# Patient Record
Sex: Male | Born: 1985 | Race: White | Hispanic: No | Marital: Single | Smoking: Former smoker
Health system: Southern US, Community
[De-identification: ages and names within clinical notes are randomized; demographics above are authoritative.]

## PROBLEM LIST (undated history)

## (undated) DIAGNOSIS — G589 Mononeuropathy, unspecified: Secondary | ICD-10-CM

## (undated) DIAGNOSIS — K5790 Diverticulosis of intestine, part unspecified, without perforation or abscess without bleeding: Secondary | ICD-10-CM

## (undated) HISTORY — DX: Diverticulosis of intestine, part unspecified, without perforation or abscess without bleeding: K57.90

---
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
## 2005-12-16 ENCOUNTER — Emergency Department (HOSPITAL_COMMUNITY): Admission: EM | Admit: 2005-12-16 | Discharge: 2005-12-16 | Payer: Self-pay | Admitting: Emergency Medicine

## 2006-07-10 ENCOUNTER — Emergency Department (HOSPITAL_COMMUNITY): Admission: EM | Admit: 2006-07-10 | Discharge: 2006-07-10 | Payer: Self-pay | Admitting: Emergency Medicine

## 2006-07-27 ENCOUNTER — Emergency Department (HOSPITAL_COMMUNITY): Admission: EM | Admit: 2006-07-27 | Discharge: 2006-07-27 | Payer: Self-pay | Admitting: Family Medicine

## 2007-01-11 ENCOUNTER — Emergency Department (HOSPITAL_COMMUNITY): Admission: EM | Admit: 2007-01-11 | Discharge: 2007-01-11 | Payer: Self-pay | Admitting: Family Medicine

## 2007-02-15 ENCOUNTER — Emergency Department (HOSPITAL_COMMUNITY): Admission: EM | Admit: 2007-02-15 | Discharge: 2007-02-15 | Payer: Self-pay | Admitting: Emergency Medicine

## 2007-03-24 ENCOUNTER — Emergency Department (HOSPITAL_COMMUNITY): Admission: EM | Admit: 2007-03-24 | Discharge: 2007-03-24 | Payer: Self-pay | Admitting: Family Medicine

## 2008-01-27 ENCOUNTER — Emergency Department (HOSPITAL_COMMUNITY): Admission: EM | Admit: 2008-01-27 | Discharge: 2008-01-27 | Payer: Self-pay | Admitting: Emergency Medicine

## 2009-11-28 ENCOUNTER — Emergency Department (HOSPITAL_COMMUNITY): Admission: EM | Admit: 2009-11-28 | Discharge: 2009-11-28 | Payer: Self-pay | Admitting: Family Medicine

## 2010-11-07 ENCOUNTER — Emergency Department (HOSPITAL_COMMUNITY)
Admission: EM | Admit: 2010-11-07 | Discharge: 2010-11-08 | Disposition: A | Discharge status: Home / Self Care | Payer: Self-pay | Attending: Emergency Medicine | Admitting: Emergency Medicine

## 2010-11-07 DIAGNOSIS — R1110 Vomiting, unspecified: Secondary | ICD-10-CM | POA: Insufficient documentation

## 2010-11-07 DIAGNOSIS — R109 Unspecified abdominal pain: Secondary | ICD-10-CM | POA: Insufficient documentation

## 2010-11-07 LAB — COMPREHENSIVE METABOLIC PANEL
ALT: 15 U/L (ref 0–53)
AST: 18 U/L (ref 0–37)
Albumin: 4.4 g/dL (ref 3.5–5.2)
Alkaline Phosphatase: 53 U/L (ref 39–117)
BUN: 17 mg/dL (ref 6–23)
CO2: 28 mEq/L (ref 19–32)
Calcium: 9.3 mg/dL (ref 8.4–10.5)
Chloride: 109 mEq/L (ref 96–112)
Creatinine, Ser: 0.9 mg/dL (ref 0.4–1.5)
GFR calc Af Amer: >60 mL/min (ref >60)
GFR calc non Af Amer: >60 mL/min (ref >60)
Glucose, Bld: 108 mg/dL — ABNORMAL HIGH (ref 70–99)
Potassium: 3.7 mEq/L (ref 3.5–5.1)
Sodium: 141 mEq/L (ref 135–145)
Total Bilirubin: 0.2 mg/dL — ABNORMAL LOW (ref 0.3–1.2)
Total Protein: 6.5 g/dL (ref 6.0–8.3)

## 2010-11-07 LAB — CBC
HCT: 38.2 % — ABNORMAL LOW (ref 39.0–52.0)
Hemoglobin: 13.2 g/dL (ref 13.0–17.0)
MCH: 30.1 pg (ref 26.0–34.0)
MCHC: 34.6 g/dL (ref 30.0–36.0)
MCV: 87.2 fL (ref 78.0–100.0)
Platelets: 179 10*3/uL (ref 150–400)
RBC: 4.38 MIL/uL (ref 4.22–5.81)
RDW: 12.5 % (ref 11.5–15.5)
WBC: 7.9 10*3/uL (ref 4.0–10.5)

## 2010-11-07 LAB — URINALYSIS, ROUTINE W REFLEX MICROSCOPIC
Glucose, UA: NEGATIVE mg/dL
Hgb urine dipstick: NEGATIVE
Nitrite: NEGATIVE
Protein, ur: NEGATIVE mg/dL
Specific Gravity, Urine: 1.025 (ref 1.005–1.030)
Urobilinogen, UA: 1 mg/dL (ref 0.0–1.0)
pH: 8 (ref 5.0–8.0)

## 2010-11-07 LAB — DIFFERENTIAL
Basophils Absolute: 0 10*3/uL (ref 0.0–0.1)
Basophils Relative: 0 % (ref 0–1)
Eosinophils Absolute: 0.1 10*3/uL (ref 0.0–0.7)
Eosinophils Relative: 1 % (ref 0–5)
Lymphocytes Relative: 22 % (ref 12–46)
Lymphs Abs: 1.7 10*3/uL (ref 0.7–4.0)
Monocytes Absolute: 0.7 10*3/uL (ref 0.1–1.0)
Monocytes Relative: 9 % (ref 3–12)
Neutro Abs: 5.3 10*3/uL (ref 1.7–7.7)
Neutrophils Relative %: 68 % (ref 43–77)

## 2010-11-07 LAB — OCCULT BLOOD, POC DEVICE: Fecal Occult Bld: NEGATIVE

## 2010-11-18 ENCOUNTER — Emergency Department (HOSPITAL_COMMUNITY)
Admission: EM | Admit: 2010-11-18 | Discharge: 2010-11-18 | Disposition: A | Discharge status: Home / Self Care | Payer: Self-pay | Attending: Emergency Medicine | Admitting: Emergency Medicine

## 2010-11-18 DIAGNOSIS — R109 Unspecified abdominal pain: Secondary | ICD-10-CM | POA: Insufficient documentation

## 2010-11-18 LAB — URINALYSIS, ROUTINE W REFLEX MICROSCOPIC
Glucose, UA: NEGATIVE mg/dL
Ketones, ur: NEGATIVE mg/dL
Protein, ur: NEGATIVE mg/dL
Urobilinogen, UA: 0.2 mg/dL (ref 0.0–1.0)

## 2010-11-18 LAB — COMPREHENSIVE METABOLIC PANEL
Albumin: 4.7 g/dL (ref 3.5–5.2)
BUN: 13 mg/dL (ref 6–23)
Calcium: 9.3 mg/dL (ref 8.4–10.5)
Chloride: 106 mEq/L (ref 96–112)
Creatinine, Ser: 0.8 mg/dL (ref 0.4–1.5)
GFR calc non Af Amer: >60 mL/min (ref >60)
Total Bilirubin: 0.4 mg/dL (ref 0.3–1.2)

## 2010-11-18 LAB — DIFFERENTIAL
Basophils Absolute: 0.1 10*3/uL (ref 0.0–0.1)
Basophils Relative: 1 % (ref 0–1)
Eosinophils Absolute: 0.1 10*3/uL (ref 0.0–0.7)
Eosinophils Relative: 1 % (ref 0–5)
Monocytes Absolute: 1.4 10*3/uL — ABNORMAL HIGH (ref 0.1–1.0)
Monocytes Relative: 11 % (ref 3–12)

## 2010-11-18 LAB — CBC
MCH: 30 pg (ref 26.0–34.0)
MCHC: 34.5 g/dL (ref 30.0–36.0)
Platelets: 198 10*3/uL (ref 150–400)
RDW: 12.4 % (ref 11.5–15.5)

## 2011-05-14 LAB — DIFFERENTIAL
Basophils Absolute: 0
Lymphocytes Relative: 23
Neutro Abs: 5.4

## 2011-05-14 LAB — CBC
Hemoglobin: 14.9
Platelets: 181
RDW: 13
WBC: 8.4

## 2011-05-14 LAB — POCT INFECTIOUS MONO SCREEN: Mono Screen: NEGATIVE

## 2011-05-14 LAB — POCT RAPID STREP A: Streptococcus, Group A Screen (Direct): NEGATIVE

## 2012-09-28 ENCOUNTER — Encounter (HOSPITAL_COMMUNITY): Payer: Self-pay | Admitting: Emergency Medicine

## 2012-09-28 ENCOUNTER — Emergency Department (HOSPITAL_COMMUNITY)
Admission: EM | Admit: 2012-09-28 | Discharge: 2012-09-28 | Disposition: A | Discharge status: Home / Self Care | Payer: Self-pay | Attending: Emergency Medicine | Admitting: Emergency Medicine

## 2012-09-28 DIAGNOSIS — F17200 Nicotine dependence, unspecified, uncomplicated: Secondary | ICD-10-CM | POA: Insufficient documentation

## 2012-09-28 DIAGNOSIS — H5710 Ocular pain, unspecified eye: Secondary | ICD-10-CM | POA: Insufficient documentation

## 2012-09-28 DIAGNOSIS — H5712 Ocular pain, left eye: Secondary | ICD-10-CM

## 2012-09-28 DIAGNOSIS — Z79899 Other long term (current) drug therapy: Secondary | ICD-10-CM | POA: Insufficient documentation

## 2012-09-28 MED ORDER — HYDROCODONE-ACETAMINOPHEN 5-325 MG PO TABS: 2.0000 | ORAL_TABLET | ORAL | Status: DC | PRN

## 2012-09-28 MED ORDER — FLUORESCEIN SODIUM 1 MG OP STRP
ORAL_STRIP | OPHTHALMIC | Status: AC
  Administered 2012-09-28: 2
  Filled 2012-09-28: qty 2

## 2012-09-28 MED ORDER — ERYTHROMYCIN 5 MG/GM OP OINT: TOPICAL_OINTMENT | OPHTHALMIC | Status: DC

## 2012-09-28 MED ORDER — TETRACAINE HCL 0.5 % OP SOLN
1.0000 [drp] | Freq: Once | OPHTHALMIC | Status: AC
  Administered 2012-09-28: 1 [drp] via OPHTHALMIC
  Filled 2012-09-28: qty 2

## 2012-09-28 NOTE — ED Provider Notes (Signed)
History     CSN: [id number]  Arrival date & time 09/28/12  1240   First MD Initiated Contact with Patient 09/28/12 1249      Chief Complaint  Patient presents with  . Eye Pain    (Consider location/radiation/quality/duration/timing/severity/associated sxs/prior treatment) HPI Comments: This is a 27 year old male, no past medical history, who presents emergency department with chief complaint of left eye pain and irritation. Patient states that he was walking around outside, when he thought something was blown into his eye. He states that he has irrigated his eye copiously, but still feels irritated. He states that the irritation is moderate in severity. It is associated with tearing, and reluctance to open the eye. He's not tried taking any medicine for his symptoms.he denies blurred vision, or double vision.  The history is provided by the patient. No language interpreter was used.    History reviewed. No pertinent past medical history.  History reviewed. No pertinent past surgical history.  History reviewed. No pertinent family history.  History  Substance Use Topics  . Smoking status: Current Every Day Smoker  . Smokeless tobacco: Not on file  . Alcohol Use: No      Review of Systems  All other systems reviewed and are negative.    Allergies  Review of patient's allergies indicates no known allergies.  Home Medications   Current Outpatient Rx  Name  Route  Sig  Dispense  Refill  . ranitidine (ZANTAC) 75 MG tablet   Oral   Take 75 mg by mouth 2 (two) times daily.         Marland Kitchen erythromycin ophthalmic ointment      Place a 1/2 inch ribbon of ointment into the lower eyelid.   1 g   0   . HYDROcodone-acetaminophen (NORCO/VICODIN) 5-325 MG per tablet   Oral   Take 2 tablets by mouth every 4 (four) hours as needed for pain.   6 tablet   0     BP 150/93  Pulse 88  Temp(Src) 97.7 F (36.5 C) (Oral)  Resp 20  SpO2 99%  Physical Exam  Nursing note and  vitals reviewed. Constitutional: He is oriented to person, place, and time. He appears well-developed and well-nourished.  HENT:  Head: Normocephalic and atraumatic.  Eyes: Conjunctivae and EOM are normal. Pupils are equal, round, and reactive to light. Right eye exhibits no discharge. Left eye exhibits no discharge. No scleral icterus.  No foreign bodies seen, conjunctiva is red, eyelid was everted, also with no evidence of foreign body. Cornea and conjunctiva were inspected with the [facility] lamp, which revealed no corneal abrasions, eye was normal pressure  Neck: Normal range of motion.  Cardiovascular: Normal rate.   Pulmonary/Chest: Effort normal.  Abdominal: He exhibits no distension.  Musculoskeletal: Normal range of motion.  Neurological: He is alert and oriented to person, place, and time.  Skin: Skin is dry.  Psychiatric: He has a normal mood and affect. His behavior is normal. Judgment and thought content normal.    ED Course  Procedures (including critical care time)  Labs Reviewed - No data to display No results found.   1. Eye pain, left       MDM  27 year old male with eye irritation. No foreign bodies were visualized. Irrigated the patient's eye with a Morgan lens, after which she reports feeling much better. I will discharge the patient with Romycin, and a few pain pills. Patient is encouraged to followup with ophthalmology tomorrow if his symptoms  do not improve. Patient understands and agrees with the plan. He is stable and ready for discharge.        Roxy Horseman, PA-C 09/28/12 [phone]

## 2012-09-28 NOTE — ED Notes (Signed)
Pt sts left eye pain and irritation after getting something in left eye; redness and watering noticed

## 2012-09-29 NOTE — ED Provider Notes (Signed)
Medical screening examination/treatment/procedure(s) were performed by non-physician practitioner and as supervising physician I was immediately available for consultation/collaboration.    Aubrea Meixner D Shashana Fullington, MD 09/29/12 0856 

## 2013-02-18 ENCOUNTER — Emergency Department (HOSPITAL_COMMUNITY)
Admission: EM | Admit: 2013-02-18 | Discharge: 2013-02-18 | Disposition: A | Discharge status: Home / Self Care | Payer: Self-pay | Attending: Emergency Medicine | Admitting: Emergency Medicine

## 2013-02-18 ENCOUNTER — Encounter (HOSPITAL_COMMUNITY): Payer: Self-pay | Admitting: *Deleted

## 2013-02-18 DIAGNOSIS — Z79899 Other long term (current) drug therapy: Secondary | ICD-10-CM | POA: Insufficient documentation

## 2013-02-18 DIAGNOSIS — F17200 Nicotine dependence, unspecified, uncomplicated: Secondary | ICD-10-CM | POA: Insufficient documentation

## 2013-02-18 DIAGNOSIS — L255 Unspecified contact dermatitis due to plants, except food: Secondary | ICD-10-CM | POA: Insufficient documentation

## 2013-02-18 MED ORDER — CLOBETASOL PROPIONATE 0.05 % EX CREA: TOPICAL_CREAM | CUTANEOUS | Status: DC

## 2013-02-18 NOTE — ED Notes (Signed)
Pt reports progressively worsening rash to bilateral arms, left side of neck, and left of face around eye. Pt states that he was clearing a sign on Friday that may of had poison ivy. States yesterday noticed rash and this am states rash had started to spread and become very uncomfortable. Airway is intact. Pt is cholamine lotion to arms. Pt took benadryl around 9pm 7/19.

## 2013-02-18 NOTE — ED Provider Notes (Signed)
   History    CSN: [id number] Arrival date & time 02/18/13  4098  First MD Initiated Contact with Patient 02/18/13 [phone]     Chief Complaint  Patient presents with  . Rash   (Consider location/radiation/quality/duration/timing/severity/associated sxs/prior Treatment) HPI This is a 27 year old male who was exposed topically to some shrubbery as he was cleaning around a stop sign. It apparently contained poison ivy as he developed a vesicular rash on his arms yesterday which worsened this morning. It is very pruritic. He has been treating it with Benadryl and topical calamine without adequate relief. He also has a small amount adjacent to the left eye. He denies throat swelling, shortness of breath, nausea, vomiting or diarrhea.  No past medical history on file. No past surgical history on file. No family history on file. History  Substance Use Topics  . Smoking status: Current Every Day Smoker  . Smokeless tobacco: Not on file  . Alcohol Use: No    Review of Systems  All other systems reviewed and are negative.     Allergies  Review of patient's allergies indicates no known allergies.  Home Medications   Current Outpatient Rx  Name  Route  Sig  Dispense  Refill  . erythromycin ophthalmic ointment      Place a 1/2 inch ribbon of ointment into the lower eyelid.   1 g   0   . HYDROcodone-acetaminophen (NORCO/VICODIN) 5-325 MG per tablet   Oral   Take 2 tablets by mouth every 4 (four) hours as needed for pain.   6 tablet   0   . ranitidine (ZANTAC) 75 MG tablet   Oral   Take 75 mg by mouth 2 (two) times daily.          BP 144/89  Pulse 84  Temp(Src) 97 F (36.1 C) (Oral)  Resp 20  SpO2 100%  Physical Exam General: Well-developed, well-nourished male in no acute distress; appearance consistent with age of record HENT: normocephalic, atraumatic Eyes: pupils equal round and reactive to light; extraocular muscles intact Neck: supple Heart: regular rate and  rhythm Lungs: clear to auscultation bilaterally Abdomen: soft; nondistended Extremities: No deformity; full range of motion Neurologic: Awake, alert and oriented; motor function intact in all extremities and symmetric; no facial droop Skin: Warm and dry; sparse vesicular rash of the upper extremities consistent with poison oak or ivy; small area of similar rash adjacent to medial aspect of left eye Psychiatric: Normal mood and affect    ED Course  Procedures (including critical care time)   MDM  We'll treat with topical clobetasol and invited him to continue Benadryl for itching.  Hanley Seamen, MD 02/18/13 [phone]

## 2013-02-19 ENCOUNTER — Encounter (HOSPITAL_COMMUNITY): Payer: Self-pay | Admitting: *Deleted

## 2013-02-19 ENCOUNTER — Emergency Department (HOSPITAL_COMMUNITY)
Admission: EM | Admit: 2013-02-19 | Discharge: 2013-02-19 | Disposition: A | Discharge status: Home / Self Care | Payer: Self-pay | Attending: Emergency Medicine | Admitting: Emergency Medicine

## 2013-02-19 DIAGNOSIS — L237 Allergic contact dermatitis due to plants, except food: Secondary | ICD-10-CM

## 2013-02-19 DIAGNOSIS — Z79899 Other long term (current) drug therapy: Secondary | ICD-10-CM | POA: Insufficient documentation

## 2013-02-19 DIAGNOSIS — F17200 Nicotine dependence, unspecified, uncomplicated: Secondary | ICD-10-CM | POA: Insufficient documentation

## 2013-02-19 DIAGNOSIS — L255 Unspecified contact dermatitis due to plants, except food: Secondary | ICD-10-CM | POA: Insufficient documentation

## 2013-02-19 MED ORDER — PREDNISONE 20 MG PO TABS: 60.0000 mg | ORAL_TABLET | Freq: Every day | ORAL | Status: DC

## 2013-02-19 MED ORDER — PREDNISONE 20 MG PO TABS
60.0000 mg | ORAL_TABLET | Freq: Once | ORAL | Status: AC
  Administered 2013-02-19: 60 mg via ORAL
  Filled 2013-02-19: qty 3

## 2013-02-19 NOTE — ED Provider Notes (Signed)
History    This chart was scribed for non-physician practitioner Arnoldo Hooker PA-C working with Carleene Cooper III, MD by Donne Anon, ED Scribe. This patient was seen in room TR05C/TR05C and the patient's care was started at 1602.  CSN: [id number] Arrival date & time 02/19/13  1528  First MD Initiated Contact with Patient 02/19/13 [phone]     Chief Complaint  Patient presents with  . Poison Ivy     The history is provided by the patient. No language interpreter was used.   HPI Comments: Terrence Warner is a 27 y.o. male who presents to the Emergency Department complaining of 2 days of a gradual onset, gradually worsening rash that began on his face and has since spread to his arms, neck and groin. He was seen in the ED yesterday and given a topical cream, but states the rash has worsened, prompting him to return. He was exposed to poison ivy 2 days ago while he was working outside. He states that Benadryl has helped with the itching. He denies visual disturbance or any other pain.   History reviewed. No pertinent past medical history. History reviewed. No pertinent past surgical history. No family history on file. History  Substance Use Topics  . Smoking status: Current Every Day Smoker  . Smokeless tobacco: Not on file  . Alcohol Use: No    Review of Systems  Eyes: Negative for visual disturbance.  Skin: Positive for rash.  All other systems reviewed and are negative.    Allergies  Review of patient's allergies indicates no known allergies.  Home Medications   Current Outpatient Rx  Name  Route  Sig  Dispense  Refill  . calamine lotion   Topical   Apply 1 application topically as needed (for skin irritation).         . clobetasol cream (TEMOVATE) 0.05 %      Apply a thin layer to the affected areas twice daily.   30 g   0   . diphenhydrAMINE (BENADRYL) 25 MG tablet   Oral   Take 25 mg by mouth every 8 (eight) hours as needed for itching.         .  Methadone HCl (METHADOSE PO)   Oral   Take 90 mg by mouth daily.         . ranitidine (ZANTAC) 150 MG tablet   Oral   Take 150 mg by mouth 2 (two) times daily.          BP 157/93  Pulse 88  Temp(Src) 98 F (36.7 C) (Oral)  Resp 18  SpO2 100%  Physical Exam  Nursing note and vitals reviewed. Constitutional: He appears well-developed and well-nourished. No distress.  HENT:  Head: Normocephalic and atraumatic.  Eyes: Conjunctivae are normal. Pupils are equal, round, and reactive to light.  Neck: Neck supple. No tracheal deviation present.  Cardiovascular: Normal rate.   Pulmonary/Chest: Effort normal. No respiratory distress.  Musculoskeletal: Normal range of motion.  Neurological: He is alert.  Skin: Skin is warm and dry. Rash noted.  Generalize maculopapular rash concentrated most on the face. No blisters. No drainage. Mild excoriation.   Psychiatric: He has a normal mood and affect. His behavior is normal.    ED Course  Procedures (including critical care time) DIAGNOSTIC STUDIES: Oxygen Saturation is 100% on RA, normal by my interpretation.    COORDINATION OF CARE: 4:16 PM Discussed treatment plan which includes oral steroid with pt at bedside and pt  agreed to plan. Will give note for work and refill topical cream prescription.    Labs Reviewed - No data to display No results found. No diagnosis found. 1. Contact dermatitis due to poison ivy MDM  Extensive distribution of Poison ivy involving the face, arms legs requiring PO steroids.   I personally performed the services described in this documentation, which was scribed in my presence. The recorded information has been reviewed and is accurate.     Arnoldo Hooker, PA-C 02/20/13 0007

## 2013-02-19 NOTE — ED Notes (Signed)
Pt was seen here yesterday and given cream for poison ivy rash and now it has spread to face and genital area.  Pt states usually he gets some steroids but not this time

## 2013-02-20 NOTE — ED Provider Notes (Signed)
Medical screening examination/treatment/procedure(s) were performed by non-physician practitioner and as supervising physician I was immediately available for consultation/collaboration.   Carleene Cooper III, MD 02/20/13 1300

## 2016-04-29 ENCOUNTER — Encounter (HOSPITAL_COMMUNITY): Payer: Self-pay | Admitting: Emergency Medicine

## 2016-04-29 ENCOUNTER — Emergency Department (HOSPITAL_COMMUNITY)
Admission: EM | Admit: 2016-04-29 | Discharge: 2016-04-29 | Disposition: A | Discharge status: Home / Self Care | Payer: 59 | Attending: Emergency Medicine | Admitting: Emergency Medicine

## 2016-04-29 DIAGNOSIS — J019 Acute sinusitis, unspecified: Secondary | ICD-10-CM | POA: Diagnosis not present

## 2016-04-29 DIAGNOSIS — F17210 Nicotine dependence, cigarettes, uncomplicated: Secondary | ICD-10-CM | POA: Insufficient documentation

## 2016-04-29 DIAGNOSIS — R0981 Nasal congestion: Secondary | ICD-10-CM | POA: Diagnosis present

## 2016-04-29 MED ORDER — AMOXICILLIN 500 MG PO CAPS: 500.0000 mg | ORAL_CAPSULE | Freq: Three times a day (TID) | ORAL | 0 refills | Status: DC

## 2016-04-29 NOTE — ED Provider Notes (Signed)
[facility] DEPT Provider Note   CSN: [id number] Arrival date & time: 04/29/16  0845     History   Chief Complaint Chief Complaint  Patient presents with  . Headache  . Nasal Congestion    HPI LELEND AGUINO is a 30 y.o. male.  Patient is a 30 year old male with no significant past medical history. He presents with a two-week history of sinus congestion, intermittent fevers, and worsening forehead and facial pain. He has tried decongestants with little relief. He denies any sick contacts. He denies any chest congestion, cough, or difficulty breathing. He denies any aggravating factors.      History reviewed. No pertinent past medical history.  There are no active problems to display for this patient.   History reviewed. No pertinent surgical history.     Home Medications    Prior to Admission medications   Medication Sig Start Date End Date Taking? Authorizing Provider  acetaminophen (TYLENOL) 500 MG tablet Take 1,000 mg by mouth every 6 (six) hours as needed for mild pain.   Yes Historical Provider, MD  diphenhydrAMINE (BENADRYL) 25 MG tablet Take 25 mg by mouth every 8 (eight) hours as needed for itching.   Yes Historical Provider, MD  methadone (DOLOPHINE) 10 MG/ML solution Take 15 mg by mouth daily.   Yes Historical Provider, MD  ranitidine (ZANTAC) 150 MG tablet Take 150 mg by mouth daily as needed for heartburn.    Yes Historical Provider, MD    Family History History reviewed. No pertinent family history.  Social History Social History  Substance Use Topics  . Smoking status: Current Every Day Smoker    Packs/day: 0.50    Types: Cigarettes  . Smokeless tobacco: Not on file  . Alcohol use No     Allergies   Review of patient's allergies indicates no known allergies.   Review of Systems Review of Systems  All other systems reviewed and are negative.    Physical Exam Updated Vital Signs BP (!) 165/117 (BP Location: Left Arm)   Pulse 117    Temp 97.5 F (36.4 C) (Oral)   Resp 20   Ht 5\' 9"  (1.753 m)   Wt 138 lb (62.6 kg)   SpO2 100%   BMI 20.38 kg/m   Physical Exam  Constitutional: He is oriented to person, place, and time. He appears well-developed and well-nourished. No distress.  HENT:  Head: Normocephalic and atraumatic.  Mouth/Throat: Oropharynx is clear and moist.  There is some frontal and maxillary sinus tenderness to palpation.  Eyes: EOM are normal. Pupils are equal, round, and reactive to light.  Neck: Normal range of motion. Neck supple.  Cardiovascular: Normal rate and regular rhythm.  Exam reveals no friction rub.   No murmur heard. Pulmonary/Chest: Effort normal and breath sounds normal. No respiratory distress. He has no wheezes. He has no rales.  Abdominal: Soft. Bowel sounds are normal. He exhibits no distension. There is no tenderness.  Musculoskeletal: Normal range of motion. He exhibits no edema.  Neurological: He is alert and oriented to person, place, and time. No cranial nerve deficit. He exhibits normal muscle tone. Coordination normal.  Skin: Skin is warm and dry. He is not diaphoretic.  Nursing note and vitals reviewed.    ED Treatments / Results  Labs (all labs ordered are listed, but only abnormal results are displayed) Labs Reviewed - No data to display  EKG  EKG Interpretation None       Radiology No results found.  Procedures  Procedures (including critical care time)  Medications Ordered in ED Medications - No data to display   Initial Impression / Assessment and Plan / ED Course  I have reviewed the triage vital signs and the nursing notes.  Pertinent labs & imaging results that were available during my care of the patient were reviewed by me and considered in my medical decision making (see chart for details).  Clinical Course    Neurologic exam is nonfocal and his symptoms sound like acute sinusitis. He has waited 2 weeks, however symptoms continued to worsen.  He will be treated with antibiotics, decongestants, continue Tylenol/Motrin, and follow-up if not improving.  Final Clinical Impressions(s) / ED Diagnoses   Final diagnoses:  None    New Prescriptions New Prescriptions   No medications on file     Veryl Speak, MD 04/29/16 1017

## 2016-04-29 NOTE — ED Triage Notes (Signed)
Per pt has had cold symptoms for the last 2 weeks. Pt reports using OTC medications with little relief of symptoms. Reports last Friday developed headache that hasn't gone away. Per pt had fever and vomiting Friday as well none today. VSS.

## 2016-04-29 NOTE — Discharge Instructions (Signed)
Amoxicillin as prescribed.  Sudafed as per package instructions.  Tylenol 1000 mg rotated with Motrin 600 mg every 4 hours as needed for pain or fever.  Follow up with your primary Dr. if you're not improving in the next week.

## 2020-02-20 ENCOUNTER — Other Ambulatory Visit: Payer: Self-pay

## 2020-02-20 ENCOUNTER — Encounter (HOSPITAL_COMMUNITY): Payer: Self-pay

## 2020-02-20 ENCOUNTER — Ambulatory Visit (HOSPITAL_COMMUNITY)
Admission: EM | Admit: 2020-02-20 | Discharge: 2020-02-20 | Disposition: A | Discharge status: Home / Self Care | Payer: 59 | Attending: Emergency Medicine | Admitting: Emergency Medicine

## 2020-02-20 DIAGNOSIS — Z20822 Contact with and (suspected) exposure to covid-19: Secondary | ICD-10-CM | POA: Diagnosis not present

## 2020-02-20 DIAGNOSIS — R05 Cough: Secondary | ICD-10-CM | POA: Insufficient documentation

## 2020-02-20 DIAGNOSIS — R197 Diarrhea, unspecified: Secondary | ICD-10-CM | POA: Insufficient documentation

## 2020-02-20 DIAGNOSIS — J069 Acute upper respiratory infection, unspecified: Secondary | ICD-10-CM | POA: Insufficient documentation

## 2020-02-20 DIAGNOSIS — Z79899 Other long term (current) drug therapy: Secondary | ICD-10-CM | POA: Diagnosis not present

## 2020-02-20 DIAGNOSIS — F17210 Nicotine dependence, cigarettes, uncomplicated: Secondary | ICD-10-CM | POA: Diagnosis not present

## 2020-02-20 MED ORDER — BENZONATATE 100 MG PO CAPS: 100.0000 mg | ORAL_CAPSULE | Freq: Three times a day (TID) | ORAL | 0 refills | Status: DC

## 2020-02-20 NOTE — Discharge Instructions (Signed)
Push fluids to ensure adequate hydration and keep secretions thin.  Tylenol and/or ibuprofen as needed for pain or fevers.  Over the counter medications as needed for symptoms.  Tessalon as needed for cough.  Continue to decrease smoking to quit as able.  Self isolate until covid results are back and negative.  Will notify you by phone of any positive findings. Your negative results will be sent through your MyChart.     If worsening- increased shortness of breath, difficulty breathing, fevers, or otherwise worsening please return

## 2020-02-20 NOTE — ED Triage Notes (Signed)
Pt presents with cough, nasal congestion, headache, chills x 3 days. Pt needs COVID test to go back to work. Pt has the [employer] vaccine.

## 2020-02-20 NOTE — ED Provider Notes (Signed)
[facility]    CSN: [id number] Arrival date & time: 02/20/20  2633      History   Chief Complaint Chief Complaint  Patient presents with  . Nasal Congestion  . Generalized Body Aches  . Headache  . Cough    HPI Terrence Warner is a 34 y.o. male.   [name] presents with complaints of dry cough which causes some headache. Nasal congestion. Has been taking over the counter decongestants, which he attributes to now having loose stools as well. No nausea, vomiting or abdominal pain. No fevers. No shortness of breath . He has had covid vaccine. He does smoke. No previous needs for inhalers. No known ill contacts.    ROS per HPI, negative if not otherwise mentioned.      History reviewed. No pertinent past medical history.  There are no problems to display for this patient.   History reviewed. No pertinent surgical history.     Home Medications    Prior to Admission medications   Medication Sig Start Date End Date Taking? Authorizing Provider  acetaminophen (TYLENOL) 500 MG tablet Take 1,000 mg by mouth every 6 (six) hours as needed for mild pain.    [provider]  amoxicillin (AMOXIL) 500 MG capsule Take 1 capsule (500 mg total) by mouth 3 (three) times daily. 04/29/16   Veryl Speak, MD  benzonatate (TESSALON) 100 MG capsule Take 1-2 capsules (100-200 mg total) by mouth every 8 (eight) hours. 02/20/20   Zigmund Gottron, NP  diphenhydrAMINE (BENADRYL) 25 MG tablet Take 25 mg by mouth every 8 (eight) hours as needed for itching.    [provider]  methadone (DOLOPHINE) 10 MG/ML solution Take 15 mg by mouth daily.    [provider]  ranitidine (ZANTAC) 150 MG tablet Take 150 mg by mouth daily as needed for heartburn.     [provider]    Family History History reviewed. No pertinent family history.  Social History Social History   Tobacco Use  . Smoking status: Current Every Day Smoker    Packs/day: 0.50     Types: Cigarettes  . Smokeless tobacco: Never Used  Substance Use Topics  . Alcohol use: No  . 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   Review of Systems Review of Systems   Physical Exam Triage Vital Signs ED Triage Vitals  Enc Vitals Group     BP 02/20/20 0828 (!) 154/110     Pulse Rate 02/20/20 0828 76     Resp 02/20/20 0828 19     Temp 02/20/20 0828 97.8 F (36.6 C)     Temp Source 02/20/20 0828 Oral     SpO2 02/20/20 0828 99 %     Weight --      Height --      Head Circumference --      Peak Flow --      Pain Score 02/20/20 0826 2     Pain Loc --      Pain Edu? --      Excl. in [place]? --    No data found.  Updated Vital Signs BP (!) 154/110 (BP Location: Left Arm)   Pulse 76   Temp 97.8 F (36.6 C) (Oral)   Resp 19   SpO2 99%    Physical Exam Vitals reviewed.  Constitutional:  Appearance: He is well-developed.  HENT:     Head: Normocephalic and atraumatic.     Right Ear: Tympanic membrane, ear canal and external ear normal.     Left Ear: Tympanic membrane, ear canal and external ear normal.     Nose: Rhinorrhea present.     Right Sinus: No maxillary sinus tenderness or frontal sinus tenderness.     Left Sinus: No maxillary sinus tenderness or frontal sinus tenderness.     Mouth/Throat:     Pharynx: Uvula midline.  Eyes:     Conjunctiva/sclera: Conjunctivae normal.     Pupils: Pupils are equal, round, and reactive to light.  Cardiovascular:     Rate and Rhythm: Normal rate.  Pulmonary:     Effort: Pulmonary effort is normal.     Comments: Strong congested cough noted; speaking clearly without difficulty, no work of breathing  Musculoskeletal:     Cervical back: Normal range of motion.  Lymphadenopathy:     Cervical: No cervical adenopathy.  Skin:    General: Skin is warm and dry.  Neurological:     Mental Status: He is alert and oriented to person, place, and  time.      UC Treatments / Results  Labs (all labs ordered are listed, but only abnormal results are displayed) Labs Reviewed  SARS CORONAVIRUS 2 (TAT 6-24 HRS)    EKG   Radiology No results found.  Procedures Procedures (including critical care time)  Medications Ordered in UC Medications - No data to display  Initial Impression / Assessment and Plan / UC Course  I have reviewed the triage vital signs and the nursing notes.  Pertinent labs & imaging results that were available during my care of the patient were reviewed by me and considered in my medical decision making (see chart for details).     Non toxic. Benign physical exam.  Congested cough. No work of breathing. Vitals stable. History and physical consistent with viral illness.  Supportive cares recommended. covid testing collected and pending. Return precautions provided. Patient verbalized understanding and agreeable to plan.   Final Clinical Impressions(s) / UC Diagnoses   Final diagnoses:  Viral URI with cough     Discharge Instructions     Push fluids to ensure adequate hydration and keep secretions thin.  Tylenol and/or ibuprofen as needed for pain or fevers.  Over the counter medications as needed for symptoms.  Tessalon as needed for cough.  Continue to decrease smoking to quit as able.  Self isolate until covid results are back and negative.  Will notify you by phone of any positive findings. Your negative results will be sent through your MyChart.     If worsening- increased shortness of breath, difficulty breathing, fevers, or otherwise worsening please return    ED Prescriptions    Medication Sig Dispense Auth. Provider   benzonatate (TESSALON) 100 MG capsule Take 1-2 capsules (100-200 mg total) by mouth every 8 (eight) hours. 21 capsule Zigmund Gottron, NP     PDMP not reviewed this encounter.   Zigmund Gottron, NP 02/20/20 [phone]

## 2020-02-21 LAB — SARS CORONAVIRUS 2 (TAT 6-24 HRS): SARS Coronavirus 2: NEGATIVE

## 2020-03-21 ENCOUNTER — Other Ambulatory Visit: Payer: Self-pay

## 2020-03-21 ENCOUNTER — Ambulatory Visit (INDEPENDENT_AMBULATORY_CARE_PROVIDER_SITE_OTHER): Payer: 59

## 2020-03-21 ENCOUNTER — Encounter (HOSPITAL_COMMUNITY): Payer: Self-pay

## 2020-03-21 ENCOUNTER — Ambulatory Visit (HOSPITAL_COMMUNITY)
Admission: EM | Admit: 2020-03-21 | Discharge: 2020-03-21 | Disposition: A | Discharge status: Home / Self Care | Payer: 59 | Attending: Family Medicine | Admitting: Family Medicine

## 2020-03-21 DIAGNOSIS — M79641 Pain in right hand: Secondary | ICD-10-CM | POA: Diagnosis not present

## 2020-03-21 MED ORDER — IBUPROFEN 600 MG PO TABS: 600.0000 mg | ORAL_TABLET | Freq: Three times a day (TID) | ORAL | 0 refills | Status: DC | PRN

## 2020-03-21 MED ORDER — HYDROCODONE-ACETAMINOPHEN 5-325 MG PO TABS: 1.0000 | ORAL_TABLET | Freq: Four times a day (QID) | ORAL | 0 refills | Status: DC | PRN

## 2020-03-21 MED ORDER — AMOXICILLIN-POT CLAVULANATE 875-125 MG PO TABS: 1.0000 | ORAL_TABLET | Freq: Two times a day (BID) | ORAL | 0 refills | Status: DC

## 2020-03-21 NOTE — ED Triage Notes (Signed)
Pt c/o right hand pain and swelling after falling playing basketball on Tuesday night. Denies elbow/shoulder/arm pain.

## 2020-03-21 NOTE — Discharge Instructions (Addendum)
No fracture on x ray There is some slight redness to the hand and with the injury and wounds there could be infection We will go ahead and try a trial of antibiotics to see if this helps As we also talked about could be a tendon issue. We will put you in a splint and have you see the hand specialist as needed.  Rest, ice.  600 mg of ibuprofen every 8 hours as needed for mild to moderate pain and hydrocodone for severe pain.  Follow up as needed for continued or worsening symptoms

## 2020-03-22 NOTE — ED Provider Notes (Signed)
[facility]    CSN: [id number] Arrival date & time: 03/21/20  1020      History   Chief Complaint Chief Complaint  Patient presents with  . Hand Pain    HPI Terrence Warner is a 34 y.o. male.   Patient is a 34 year old male that presents today with right hand pain and swelling after falling from playing basketball on Tuesday night.  Symptoms been constant worsening with worsening swelling of the right hand, erythema.  Does have abrasions to hand that are healing.  Denies any other pain to the right extremity normal range of motion of hand.  No fever, chills     History reviewed. No pertinent past medical history.  There are no problems to display for this patient.   History reviewed. No pertinent surgical history.     Home Medications    Prior to Admission medications   Medication Sig Start Date End Date Taking? Authorizing Provider  amoxicillin-clavulanate (AUGMENTIN) 875-125 MG tablet Take 1 tablet by mouth every 12 (twelve) hours. 03/21/20   Loura Halt A, NP  HYDROcodone-acetaminophen (NORCO/VICODIN) 5-325 MG tablet Take 1-2 tablets by mouth every 6 (six) hours as needed. 03/21/20   Loura Halt A, NP  ibuprofen (ADVIL) 600 MG tablet Take 1 tablet (600 mg total) by mouth every 8 (eight) hours as needed for moderate pain. 03/21/20   Orvan July, NP    Family History History reviewed. No pertinent family history.  Social History Social History   Tobacco Use  . Smoking status: Current Every Day Smoker    Packs/day: 0.50    Types: Cigarettes  . Smokeless tobacco: Never Used  Substance Use Topics  . Alcohol use: No  . 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   Review of Systems Review of Systems   Physical Exam Triage Vital Signs ED Triage Vitals  Enc Vitals Group     BP 03/21/20 1143 (!) 138/95     Pulse Rate 03/21/20 1143 75     Resp 03/21/20 1143 16     Temp  03/21/20 1143 97.8 F (36.6 C)     Temp src --      SpO2 03/21/20 1143 99 %     Weight --      Height --      Head Circumference --      Peak Flow --      Pain Score 03/21/20 1145 5     Pain Loc --      Pain Edu? --      Excl. in [place]? --    No data found.  Updated Vital Signs BP (!) 138/95   Pulse 75   Temp 97.8 F (36.6 C)   Resp 16   SpO2 99%   Visual Acuity Right Eye Distance:   Left Eye Distance:   Bilateral Distance:    Right Eye Near:   Left Eye Near:    Bilateral Near:     Physical Exam Vitals and nursing note reviewed.  Constitutional:      Appearance: Normal appearance.  HENT:     Head: Normocephalic and atraumatic.     Nose: Nose normal.  Eyes:     Conjunctiva/sclera: Conjunctivae normal.  Pulmonary:     Effort: Pulmonary effort is normal.  Musculoskeletal:     Right hand: Swelling and tenderness present. Decreased range of motion.  Cervical back: Normal range of motion.  Skin:    General: Skin is warm and dry.  Neurological:     Mental Status: He is alert.  Psychiatric:        Mood and Affect: Mood normal.      UC Treatments / Results  Labs (all labs ordered are listed, but only abnormal results are displayed) Labs Reviewed - No data to display  EKG   Radiology DG Hand Complete Right  Result Date: 03/21/2020 CLINICAL DATA:  Hand injury playing basketball.  Pain EXAM: RIGHT HAND - COMPLETE 3+ VIEW COMPARISON:  None. FINDINGS: There is no evidence of fracture or dislocation. There is no evidence of arthropathy or other focal bone abnormality. Soft tissues are unremarkable. IMPRESSION: Negative. Electronically Signed   By: Franchot Gallo M.D.   On: 03/21/2020 12:37    Procedures Procedures (including critical care time)  Medications Ordered in UC Medications - No data to display  Initial Impression / Assessment and Plan / UC Course  I have reviewed the triage vital signs and the nursing notes.  Pertinent labs & imaging results  that were available during my care of the patient were reviewed by me and considered in my medical decision making (see chart for details).     Right hand pain There was concern for fracture based on hand swelling and injury. X-ray did not show any obvious fracture Patient also with erythema to hand and mild streaking into wrist area  Concerned that the swelling could be caused by infection.  He does have multiple abrasions to hand. We will go ahead and cover with antibiotics just in case  Also concern for possible tendon injury He does have bruising to the posterior hand at the base of the thumb. Normal range of motion Will place in thumb spica splint Treating with amoxicillin.  600 of ibuprofen every 8 hours as needed for mild to moderate pain and hydrocodone for severe pain. Recommended close watch over the next 24 to 48 hours and if his hand swelling worsens or pain worsens he will need to come back for recheck or go to the ER. Also recommended to follow-up with hand specialist.  Contact given on discharge instructions.   Final Clinical Impressions(s) / UC Diagnoses   Final diagnoses:  Right hand pain     Discharge Instructions     No fracture on x ray There is some slight redness to the hand and with the injury and wounds there could be infection We will go ahead and try a trial of antibiotics to see if this helps As we also talked about could be a tendon issue. We will put you in a splint and have you see the hand specialist as needed.  Rest, ice.  600 mg of ibuprofen every 8 hours as needed for mild to moderate pain and hydrocodone for severe pain.  Follow up as needed for continued or worsening symptoms     ED Prescriptions    Medication Sig Dispense Auth. Provider   HYDROcodone-acetaminophen (NORCO/VICODIN) 5-325 MG tablet Take 1-2 tablets by mouth every 6 (six) hours as needed. 10 tablet Tamim Skog A, NP   ibuprofen (ADVIL) 600 MG tablet Take 1 tablet (600 mg  total) by mouth every 8 (eight) hours as needed for moderate pain. 30 tablet Ormond Lazo A, NP   amoxicillin-clavulanate (AUGMENTIN) 875-125 MG tablet Take 1 tablet by mouth every 12 (twelve) hours. 14 tablet Aidric Endicott A, NP     I have  reviewed the PDMP during this encounter.   Loura Halt A, NP 03/22/20 [phone]

## 2020-04-02 ENCOUNTER — Ambulatory Visit: Payer: Self-pay

## 2020-04-02 ENCOUNTER — Encounter: Payer: Self-pay | Admitting: Family Medicine

## 2020-04-02 ENCOUNTER — Ambulatory Visit (INDEPENDENT_AMBULATORY_CARE_PROVIDER_SITE_OTHER): Payer: 59 | Admitting: Family Medicine

## 2020-04-02 ENCOUNTER — Other Ambulatory Visit: Payer: Self-pay

## 2020-04-02 VITALS — BP 140/88 | HR 97 | Ht 69.0 in | Wt 152.4 lb

## 2020-04-02 DIAGNOSIS — M79641 Pain in right hand: Secondary | ICD-10-CM

## 2020-04-02 DIAGNOSIS — M2540 Effusion, unspecified joint: Secondary | ICD-10-CM

## 2020-04-02 NOTE — Progress Notes (Signed)
    Subjective:    CC: R hand/finger pain  HPI: Pt is a 33 y/o male presenting w/ c/o R hand pain/swelling after falling on his hand while playing basketball on 03/18/20.  He was seen at the [facility] on 03/21/20 w/ con't R hand pain/swelling and had a R hand XR that was negative for fracture.  Since then, he reports con't R hand pain, particularly along the R index and middle finger.  He locates his pain to his R index and R middle finger.  R hand swelling: yes Aggravating factors: trying to make a fist and R wrist extension Treatments tried: brace; ice; IBU  Diagnostic testing: R hand XR- 03/21/20  Pertinent review of Systems: No fevers or chills  Relevant historical information: No significant medical problems   Objective:    Vitals:   04/02/20 1442  BP: 140/88  Pulse: 97  SpO2: 95%   General: Well Developed, well nourished, and in no acute distress.   MSK: Right hand swollen MCP 2 and 3.  Decreased motion due to pain.  Tender palpation of this region. Intact strength to flexion and extension at digits. Sensation and capillary fill are intact distally.  Lab and Radiology Results  X-ray images right hand obtained on March 21, 2020 reviewed today.  No fractures.  Diagnostic Limited MSK Ultrasound of: Right hand second MCP Mild joint effusion with increased synovial thickness present.  Radial collateral ligament has hypoechoic change at substance without significant retraction of the ligament ends.  However there is not sufficient imaging resolution to confirm radial collateral ligament tear. Impression: Tenosynovitis with possible radial collateral ligament tear.    Patient was fitted today with a well-formed dorsal radial gutter splint right hand.   Impression and Recommendations:    Assessment and Plan: 34 y.o. male with right hand injury after falling onto his hand playing basketball.  Effectively he punched the floor as he fell with his second and third  MCP suffering a flexion injury.  I think it is also possible that he injured his radial collateral ligament and the second MCP as well.  He has traumatic synovitis of the joint without fracture seen on x-ray.  Plan to treat with Voltaren gel and splints.  Additionally refer to hand therapy and recheck in 1 month..   Orders Placed This Encounter  Procedures  . [place] LIMITED JOINT SPACE STRUCTURES UP RIGHT(NO LINKED CHARGES)    Order Specific Question:   Reason for Exam (SYMPTOM  OR DIAGNOSIS REQUIRED)    Answer:   R hand pain    Order Specific Question:   Preferred imaging location?    Answer:   [place]  . Ambulatory referral to Physical Therapy    Referral Priority:   Routine    Referral Type:   Physical Medicine    Referral Reason:   Specialty Services Required    Requested Specialty:   Physical Therapy    Number of Visits Requested:   1   No orders of the defined types were placed in this encounter.   Discussed warning signs or symptoms. Please see discharge instructions. Patient expresses understanding.   The above documentation has been reviewed and is accurate and complete Lynne Leader, M.D.

## 2020-04-02 NOTE — Patient Instructions (Signed)
Thank you for coming in today. Plan for hand PT.   I can fill out forms.   If you cannot wear the splint do buddy tape.   Recheck with me in 2-4 weeks.

## 2020-04-03 ENCOUNTER — Telehealth: Payer: Self-pay | Admitting: *Deleted

## 2020-04-03 NOTE — Telephone Encounter (Signed)
Pt called stating that he is still having R hand pain. Pt worked today and with the type of work the he does he believes that it is causing his pain to get worse. Pt would like a call back on what to do.

## 2020-04-03 NOTE — Telephone Encounter (Signed)
Return for injection if needed.  Otherwise continue the splint at work and plan for hand PT

## 2020-04-04 NOTE — Telephone Encounter (Signed)
Hey can you call patient and schedule for an injection I was finally able to get the fax sent for his PT

## 2020-04-04 NOTE — Telephone Encounter (Signed)
Spoke to patient. Appointment scheduled on Tuesday.

## 2020-04-08 ENCOUNTER — Ambulatory Visit: Payer: 59 | Admitting: Family Medicine

## 2020-04-08 ENCOUNTER — Ambulatory Visit: Payer: Self-pay

## 2020-04-08 ENCOUNTER — Encounter: Payer: Self-pay | Admitting: Family Medicine

## 2020-04-08 ENCOUNTER — Other Ambulatory Visit: Payer: Self-pay

## 2020-04-08 VITALS — BP 130/90 | HR 88 | Ht 69.0 in | Wt 158.0 lb

## 2020-04-08 DIAGNOSIS — M2540 Effusion, unspecified joint: Secondary | ICD-10-CM | POA: Diagnosis not present

## 2020-04-08 NOTE — Progress Notes (Signed)
Rito Ehrlich, am serving as a Education administrator for Dr. Lynne Leader.  Terrence Warner is a 34 y.o. male who presents to [place] at [facility] today for R hand pain at the R index and middle finger.  He was last seen by Dr. Georgina Snell on 04/02/20 and had a splint made for his R hand/wrist.  He was also referred to PT for hand therapy.  Since his last visit, pt reports the pain in his hand is worse, the pain is more constant and the R index finger has pain that will sometimes shoot up his arm. 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 With the arm being lower and trying to use the mouse with his R ring and pinky finger he is getting pain on that side of the hand from using muscles that he doesn't usually use. 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   Exam:  BP 130/90 (BP Location: Left Arm, Patient Position: Sitting, Cuff Size: Normal)   Pulse 88   Ht 5\' 9"  (1.753 m)   Wt 158 lb (71.7 kg)   SpO2 98%   BMI 23.33 kg/m  General: Well Developed, well nourished, and in no acute distress.   MSK: Right hand: Mild erythema and swelling second MCP.  Tender palpation.  Decreased motion.    Lab and Radiology Results Procedure: Real-time Ultrasound Guided Injection of right second MCP Device: Philips Affiniti 50G Images permanently stored and available for review in PACS Verbal informed consent obtained.  Discussed risks and benefits of procedure. Warned about infection bleeding damage to structures skin hypopigmentation and fat atrophy among others. Patient expresses understanding and agreement Time-out conducted.   Noted no overlying erythema,  induration, or other signs of local infection.   Skin prepped in a sterile fashion.   Local anesthesia: Topical Ethyl chloride.   With sterile technique and under real time ultrasound guidance:  40 mg of Depo-Medrol and 0.5 mL of lidocaine injected easily.   Completed without difficulty   Pain partially resolved suggesting accurate placement of the medication.   Advised to call if fevers/chills, erythema, induration, drainage, or persistent bleeding.   Images permanently stored and available for review in the ultrasound unit.  Impression: Technically successful ultrasound guided injection.      Assessment and Plan: 34 y.o. male with right hand pain due to traumatic effusion.  Likely has a disruption of his radial collateral ligament as well although this is not formally diagnosed with ultrasound.  Patient is having quite a bit of pain and difficulty with normal activities of daily living.  Plan for steroid injection as above and hand therapy as already ordered.  Continued splinting as needed.  If still not controllable and missing work patient will notify me and I will order MRI arthrogram of the hand to further characterize MCP injury.   PDMP not reviewed this encounter. Orders Placed This Encounter  Procedures  . [place] LIMITED JOINT SPACE STRUCTURES UP RIGHT(NO LINKED CHARGES)    Order Specific Question:   Reason for Exam (SYMPTOM  OR DIAGNOSIS REQUIRED)  Answer:   eval finger    Order Specific Question:   Preferred imaging location?    Answer:   [place]   No orders of the defined types were placed in this encounter.    Discussed warning signs or symptoms. Please see discharge instructions. Patient expresses understanding.   The above documentation has been reviewed and is accurate and complete Lynne Leader, M.D.

## 2020-04-08 NOTE — Patient Instructions (Signed)
Thank you for coming in today. Call or go to the ER if you develop a large red swollen joint with extreme pain or oozing puss.   Let me know if you do not hear about hand PT soon.   If just terrible let me know and I will order MRI arthrogram of the finger.  This will take 1-3 weeks from approval to get done.

## 2020-05-05 ENCOUNTER — Encounter: Payer: Self-pay | Admitting: Family Medicine

## 2020-05-05 ENCOUNTER — Other Ambulatory Visit: Payer: Self-pay

## 2020-05-05 ENCOUNTER — Ambulatory Visit (INDEPENDENT_AMBULATORY_CARE_PROVIDER_SITE_OTHER): Payer: 59 | Admitting: Family Medicine

## 2020-05-05 VITALS — BP 150/102 | HR 92 | Ht 69.0 in | Wt 153.4 lb

## 2020-05-05 DIAGNOSIS — M2540 Effusion, unspecified joint: Secondary | ICD-10-CM | POA: Diagnosis not present

## 2020-05-05 NOTE — Progress Notes (Signed)
   I, Wendy Poet, LAT, ATC, am serving as scribe for Dr. Lynne Leader.  Terrence Warner is a 34 y.o. male who presents to [place] at [facility] today for f/u of R hand pain, particularly his R index and middle fingers.  He was last seen by Dr. Georgina Snell on 04/08/20 and had an injection of his R 2nd MCP joint.  He previously had a splint made for his R hand.  Since his last visit, pt reports that he's been doing PT at [facility] x 4-5 visits.  She states that his R hand feels "ok" but notes that he con't to have some discomfort and pain.  He has been wearing the splint at work and buddy bands at other times.  He states that the injection he had at his last visit was helpful.  Diagnostic testing: R hand XR- 03/21/20  Pertinent review of systems: No fevers or chills  Relevant historical information: No significant medical problems prior to hand injury.   Exam:  BP (!) 150/102 (BP Location: Right Arm, Patient Position: Sitting, Cuff Size: Normal)   Pulse 92   Ht 5\' 9"  (1.753 m)   Wt 153 lb 6.4 oz (69.6 kg)   SpO2 95%   BMI 22.65 kg/m  General: Well Developed, well nourished, and in no acute distress.   MSK: Right hand slightly swollen second and third MCPs.  Mildly tender to palpation.  Decreased motion to full flexion.    Assessment and Plan: 34 y.o. male with traumatic synovitis right hand.  Significantly improved with hand PT.  Received a copy of report with documenting significant improvement in functional range of motion and strength.  Patient is just not all better yet.  Plan to continue hand PT and recheck back as needed.   PDMP not reviewed this encounter. No orders of the defined types were placed in this encounter.  No orders of the defined types were placed in this encounter.    Discussed warning signs or symptoms. Please see discharge instructions. Patient expresses understanding.   The above documentation has been reviewed and is accurate and complete  Lynne Leader, M.D.

## 2020-05-05 NOTE — Patient Instructions (Signed)
Thank you for coming in today.  Continue current treatment   This may take a few months to get all better.   Continue hand PT.   Recheck with me as needed.   Try to wean out of the rigid splint in the next few weeks.

## 2020-09-19 ENCOUNTER — Other Ambulatory Visit: Payer: Self-pay

## 2020-09-19 ENCOUNTER — Encounter (HOSPITAL_COMMUNITY): Payer: Self-pay

## 2020-09-19 ENCOUNTER — Ambulatory Visit (HOSPITAL_COMMUNITY)
Admission: EM | Admit: 2020-09-19 | Discharge: 2020-09-19 | Disposition: A | Discharge status: Home / Self Care | Payer: 59 | Attending: Medical Oncology | Admitting: Medical Oncology

## 2020-09-19 ENCOUNTER — Ambulatory Visit (INDEPENDENT_AMBULATORY_CARE_PROVIDER_SITE_OTHER): Payer: 59

## 2020-09-19 DIAGNOSIS — S6991XA Unspecified injury of right wrist, hand and finger(s), initial encounter: Secondary | ICD-10-CM | POA: Diagnosis not present

## 2020-09-19 DIAGNOSIS — Y9367 Activity, basketball: Secondary | ICD-10-CM

## 2020-09-19 DIAGNOSIS — M79641 Pain in right hand: Secondary | ICD-10-CM

## 2020-09-19 MED ORDER — PREDNISONE 10 MG (21) PO TBPK: ORAL_TABLET | Freq: Every day | ORAL | 0 refills | Status: DC

## 2020-09-19 NOTE — ED Provider Notes (Signed)
[place]    CSN: [id number] Arrival date & time: 09/19/20  1016      History   Chief Complaint Right Hand Pain   HPI Terrence Warner is a 35 y.o. male.   HPI   Right Hand Injury: Pt states that last night he was playing basketball when his hand was "slammed" by another player. He reports immediate 10/10 pain. This morning he noted continued pain and swelling of his right 2nd and 3rd knuckle area. He notes that this is the second time in 6 months that he has had a similar injury. No known fractures of this area. NO loss of sensation, no skin color changes or dislocation.   History reviewed. No pertinent past medical history.  Patient Active Problem List   Diagnosis Date Noted  . Traumatic synovial effusion 04/02/2020    History reviewed. No pertinent surgical history.     Home Medications    Prior to Admission medications   Medication Sig Start Date End Date Taking? Authorizing Provider  predniSONE (STERAPRED UNI-PAK 21 TAB) 10 MG (21) TBPK tablet Take by mouth daily. Take 6 tabs by mouth daily  for 2 days, then 5 tabs for 2 days, then 4 tabs for 2 days, then 3 tabs for 2 days, 2 tabs for 2 days, then 1 tab by mouth daily for 2 days 09/19/20  Yes Molly Savarino M, PA-C  ibuprofen (ADVIL) 600 MG tablet Take 1 tablet (600 mg total) by mouth every 8 (eight) hours as needed for moderate pain. 03/21/20   Orvan July, NP    Family History Family History  Family history unknown: Yes    Social History Social History   Tobacco Use  . Smoking status: Current Every Day Smoker    Packs/day: 0.50    Types: Cigarettes  . Smokeless tobacco: Never Used  Substance Use Topics  . Alcohol use: No  . 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   Review of Systems Review of Systems  As stated above in HPI Physical Exam Triage Vital Signs ED Triage Vitals  Enc Vitals Group     BP  09/19/20 1147 (!) 158/108     Pulse Rate 09/19/20 1147 77     Resp 09/19/20 1147 18     Temp 09/19/20 1147 97.6 F (36.4 C)     Temp Source 09/19/20 1147 Oral     SpO2 09/19/20 1147 97 %     Weight --      Height --      Head Circumference --      Peak Flow --      Pain Score 09/19/20 1146 9     Pain Loc --      Pain Edu? --      Excl. in [place]? --    No data found.  Updated Vital Signs BP (!) 158/108 (BP Location: Right Arm)   Pulse 77   Temp 97.6 F (36.4 C) (Oral)   Resp 18   SpO2 97%   Physical Exam Vitals and nursing note reviewed.  Musculoskeletal:     Comments: ROM of the 2nd and 3rd digit limited from pain. There is moderate edema of the 2nd and 3rd MCP joint of the right hand.   Skin:    Comments: NO skin color changes other than mild ecchymosis at the area of swelling. NO skin  breakdown  Neurological:     General: No focal deficit present.     Mental Status: He is alert.      UC Treatments / Results  Labs (all labs ordered are listed, but only abnormal results are displayed) Labs Reviewed - No data to display  EKG   Radiology DG Hand Complete Right  Result Date: 09/19/2020 CLINICAL DATA:  Injury while playing basketball EXAM: RIGHT HAND - COMPLETE 3+ VIEW COMPARISON:  March 21, 2020. FINDINGS: Frontal, oblique, and lateral views were obtained. No acute fracture or dislocation. There is bowing of the fifth metacarpal consistent with prior fracture, healed. No joint space narrowing or erosion. IMPRESSION: Evidence of old trauma involving the fifth metacarpal. No acute fracture or dislocation. No appreciable arthropathic change. Electronically Signed   By: Lowella Grip III M.D.   On: 09/19/2020 12:08    Procedures Procedures (including critical care time)  Medications Ordered in UC Medications - No data to display  Initial Impression / Assessment and Plan / UC Course  I have reviewed the triage vital signs and the nursing notes.  Pertinent labs  & imaging results that were available during my care of the patient were reviewed by me and considered in my medical decision making (see chart for details).     New. X ray does not suggest acute fracture. I have recommended a course of prednisone as this worked well for him last time a similar injury occurred and given that he has not has much relief from ibuprofen since. I have also recommended a brace and follow up next week with orthopedics. We discussed how to take this medication along with common potential side effects and precautions.  Reviewed red flag symptoms. Final Clinical Impressions(s) / UC Diagnoses   Final diagnoses:  None   Discharge Instructions   None    ED Prescriptions    Medication Sig Dispense Auth. Provider   predniSONE (STERAPRED UNI-PAK 21 TAB) 10 MG (21) TBPK tablet Take by mouth daily. Take 6 tabs by mouth daily  for 2 days, then 5 tabs for 2 days, then 4 tabs for 2 days, then 3 tabs for 2 days, 2 tabs for 2 days, then 1 tab by mouth daily for 2 days 42 tablet Taelor Waymire, Holli Humbles, [place]     PDMP not reviewed this encounter.   Hughie Closs, [place] 09/19/20 1231

## 2020-09-19 NOTE — ED Triage Notes (Signed)
Pt presents with right hand injury from basketball game last night; pt is unsure of exactly how he injured it but he has significant swelling.

## 2020-10-28 NOTE — Progress Notes (Signed)
I, Wendy Poet, LAT, ATC, am serving as scribe for Dr. Lynne Leader.  Terrence Warner is a 35 y.o. male who presents to [place] at [facility] today for f/u of R hand pain, particularly his R index and middle finger.  He was last seen by Dr. Georgina Snell on 05/05/20 and noted some improvement in his symptoms.  He was doing PT at [facility] and had a custom splint made for his hand that he was wearing at work.  He had a prior R 2nd MCP join injection on 04/08/20.  Today, pt reports that he re-injured his R hand while playing basketball when he was trying to steal the ball from anther player when he felt sharp pain in his R hand w/ pain radiating into his R forearm and triceps.  He states that he was referred to [facility] and was told not to use his hand for 2 weeks and was advised that there wasn't anything else they could do.  Now, 3-4 weeks after being seen at [facility], he states that his R hand is no better and possibly slightly worse.  He works as a Games developer and states that he hasn't been able to find a comfortable position for his R arm and hand.  He has been trying to use his L hand and arm more to compensate.  Aggravating factors include R wrist extension and R hand gripping/making a fist.  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  He gets a shooting pain on his dorsal hand and wrist, extending into his dorsal forearm when he tries to type w/ his R index finger.  He is taking Tylenol, Advil and using ice, heat and Biofreeze.  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   Exam:  BP (!) 140/96 (BP Location: Left Arm, Patient Position: Sitting, Cuff Size: Normal)   Pulse 93   Ht 5\' 9"  (1.753 m)   Wt 154 lb 12.8 oz (70.2 kg)   SpO2 97%    BMI 22.86 kg/m  General: Well Developed, well nourished, and in no acute distress.   MSK:  Right shoulder normal-appearing nontender intact range of motion however pain with abduction.  Intact strength positive Hawkins and Neer's test.  Right elbow normal-appearing normal motion.  Tender palpation lateral epicondyle.  Pain with resisted wrist and hand extension.  Right hand swelling at second and third MCP.  Tender palpation second and third MCP.  Normal strength.    Lab and Radiology Results  Procedure: Real-time Ultrasound Guided Injection of right second MCP Device: Philips Affiniti 50G Images permanently stored and available for review in PACS Verbal informed consent obtained.  Discussed risks and benefits of procedure. Warned about infection bleeding damage to structures skin hypopigmentation and fat atrophy among others. Patient expresses understanding and agreement Time-out conducted.   Noted no overlying erythema, induration, or other signs of local infection.   Skin prepped in a sterile fashion.   Local anesthesia: Topical Ethyl chloride.   With sterile technique and under real time ultrasound guidance:  40 mg of Depo-Medrol and 0.5 mL of lidocaine injected into joint. Fluid seen entering the capsule.   Completed without difficulty   Pain immediately resolved suggesting accurate placement of the medication.   Advised to call if fevers/chills,  erythema, induration, drainage, or persistent bleeding.   Images permanently stored and available for review in the ultrasound unit.  Impression: Technically successful ultrasound guided injection.         Assessment and Plan: 35 y.o. male with right hand pain after trauma.  Injury occurred about 2 weeks ago.  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  He had this previously and did not improve with PT.  He is already doing his home exercises that he learned last time and not improving.  Plan to jump right to the  treatment that was most effective last time which was injection.  Plan for injection second MCP and recheck in a few weeks.  If needed could inject the third MCP which is a lesser issue.  Additionally patient has right elbow pain thought to be lateral epicondylitis or strain of the extensor tendons in the lateral elbow.  Plan for home exercise program taught in clinic today by ATC and consider formal physical therapy.  Additionally has shoulder rotator cuff strain as well.  Again home exercise program or even formal PT if needed.  Work note provided out of work for a week and a half.  [id number]; 15 additional minutes spent for Therapeutic exercises as stated in above notes.  This included exercises focusing on stretching, strengthening, with significant focus on eccentric aspects.   Long term goals include an improvement in range of motion, strength, endurance as well as avoiding reinjury. Patient's frequency would include in 1-2 times a day, 3-5 times a week for a duration of 6-12 weeks.  Proper technique shown and discussed handout in great detail with ATC.  All questions were discussed and answered.     PDMP not reviewed this encounter. Orders Placed This Encounter  Procedures  . [place] LIMITED JOINT SPACE STRUCTURES UP RIGHT(NO LINKED CHARGES)    Order Specific Question:   Reason for Exam (SYMPTOM  OR DIAGNOSIS REQUIRED)    Answer:   R hand pain    Order Specific Question:   Preferred imaging location?    Answer:   [place]   No orders of the defined types were placed in this encounter.    Discussed warning signs or symptoms. Please see discharge instructions. Patient expresses understanding.   The above documentation has been reviewed and is accurate and complete Lynne Leader, M.D.

## 2020-10-29 ENCOUNTER — Encounter: Payer: Self-pay | Admitting: Family Medicine

## 2020-10-29 ENCOUNTER — Ambulatory Visit: Payer: 59 | Admitting: Family Medicine

## 2020-10-29 ENCOUNTER — Ambulatory Visit: Payer: Self-pay

## 2020-10-29 ENCOUNTER — Other Ambulatory Visit: Payer: Self-pay

## 2020-10-29 VITALS — BP 140/96 | HR 93 | Ht 69.0 in | Wt 154.8 lb

## 2020-10-29 DIAGNOSIS — M79631 Pain in right forearm: Secondary | ICD-10-CM

## 2020-10-29 DIAGNOSIS — M2540 Effusion, unspecified joint: Secondary | ICD-10-CM | POA: Diagnosis not present

## 2020-10-29 DIAGNOSIS — M79641 Pain in right hand: Secondary | ICD-10-CM | POA: Diagnosis not present

## 2020-10-29 NOTE — Patient Instructions (Addendum)
You had an injection today in your R hand/index finger.  Call or go to the ER if you develop a large red swollen joint with extreme pain or oozing puss.   Please use voltaren gel up to 4x daily for pain as needed.   Do the home exercises.   Please perform the exercise program that we have prepared for you and gone over in detail on a daily basis.  In addition to the handout you were provided you can access your program through: [email or web address]   Your unique program code is:  [place] in 1 month.

## 2020-11-27 NOTE — Progress Notes (Signed)
I, Wendy Poet, LAT, ATC, am serving as scribe for Dr. Lynne Leader.  Terrence Warner is a 35 y.o. male who presents to [place] at [facility] today for f/u of R hand pain after suffering a repeat injury to his hand (R index and middle finger) while playing basketball.  He was last seen by Dr. Georgina Snell on 10/29/20 and had a R index finger injection.  He was also shown a HEP for R lateral epicondylitis and for R shoulder RC tendinopathy.  Since his last visit, pt reports that he had 1.5 weeks off from work after his last visit which significantly helped his pain.  He has been doing his HEP 2-3 days/week.  Since he returned to work for about 3 weeks, he reports con't index finger MCP joint, R elbow and R shoulder pain.  He states that depending on how he lays at night, his R arm will go numb.  He con't to have issues w/ his R shoulder ROM.  He reports some R UT pain but denies any true neck pain.  He does not wear any type of brace for his hand or wrist but did try a compression glove.  He thinks he still able to work if he has a restriction to limit lifting with his right hand.  Diagnostic imaging: R hand XR- 09/19/20, 03/21/20  Pertinent review of systems: No fevers or chills  Relevant historical information: History of right hand traumatic synovitis treated with injection after failing physical therapy   Exam:  BP 140/88 (BP Location: Right Arm, Patient Position: Sitting, Cuff Size: Normal)   Pulse 76   Ht 5\' 9"  (1.753 m)   Wt 158 lb 9.6 oz (71.9 kg)   SpO2 97%   BMI 23.42 kg/m  General: Well Developed, well nourished, and in no acute distress.   MSK: C-spine normal-appearing Nontender midline. Normal cervical motion.  Right shoulder normal-appearing Nontender. Normal motion to abduction however pain with abduction. Internal rotation intact.  External Tatian intact. Positive Hawkins and Neer's test. Positive empty can test. Intact strength. Negative Yergason's and  speeds test.  Right elbow normal-appearing Tender palpation lateral epicondyles. Pain with resisted wrist extension.  Right hand tender palpation first and second MCP    Lab and Radiology Results  Procedure: Real-time Ultrasound Guided Injection of right shoulder subacromial bursa Device: Philips Affiniti 50G Images permanently stored and available for review in PACS Ultrasound evaluation right shoulder reveals intact rotator cuff tendons with mild subacromial bursitis Verbal informed consent obtained.  Discussed risks and benefits of procedure. Warned about infection bleeding damage to structures skin hypopigmentation and fat atrophy among others. Patient expresses understanding and agreement Time-out conducted.   Noted no overlying erythema, induration, or other signs of local infection.   Skin prepped in a sterile fashion.   Local anesthesia: Topical Ethyl chloride.   With sterile technique and under real time ultrasound guidance:  40 mg of Kenalog and 2 mL of Marcaine injected into subacromial bursa. Fluid seen entering the bursa.   Completed without difficulty   Pain minimally resolved suggesting accurate placement of the medication.   Advised to call if fevers/chills, erythema, induration, drainage, or persistent bleeding.   Images permanently stored and available for review in the ultrasound unit.  Impression: Technically successful ultrasound guided injection.      Assessment and Plan: 35 y.o. male with right arm pain including pain in the right shoulder elbow and hand.  This is thought to be due to  discrete issues in all 3 areas and unlikely to be due to cervical radiculopathy.  Patient had trial of subacromial injection today which did not help much.  Plan to refer to PT to treat for subacromial bursitis lateral epicondylitis and the hand pain that he has been having.  This may help determine how much of this is neck/cervical radicular issue versus discrete orthopedic  issues.  Provide work restrictions.  Recheck in 1 month.  Additionally will prescribe prednisone and gabapentin.   PDMP not reviewed this encounter. Orders Placed This Encounter  Procedures  . [place] LIMITED JOINT SPACE STRUCTURES UP RIGHT(NO LINKED CHARGES)    Order Specific Question:   Reason for Exam (SYMPTOM  OR DIAGNOSIS REQUIRED)    Answer:   eval rt shoulder    Order Specific Question:   Preferred imaging location?    Answer:   [place]  . Ambulatory referral to Physical Therapy    Referral Priority:   Routine    Referral Type:   Physical Medicine    Referral Reason:   Specialty Services Required    Requested Specialty:   Physical Therapy   Meds ordered this encounter  Medications  . predniSONE (DELTASONE) 50 MG tablet    Sig: Take 1 tablet (50 mg total) by mouth daily.    Dispense:  5 tablet    Refill:  0  . gabapentin (NEURONTIN) 300 MG capsule    Sig: Take 1 capsule (300 mg total) by mouth 3 (three) times daily as needed.    Dispense:  90 capsule    Refill:  3     Discussed warning signs or symptoms. Please see discharge instructions. Patient expresses understanding.   The above documentation has been reviewed and is accurate and complete Lynne Leader, M.D.

## 2020-11-28 ENCOUNTER — Ambulatory Visit: Payer: Self-pay

## 2020-11-28 ENCOUNTER — Other Ambulatory Visit: Payer: Self-pay

## 2020-11-28 ENCOUNTER — Ambulatory Visit: Payer: 59 | Admitting: Family Medicine

## 2020-11-28 VITALS — BP 140/88 | HR 76 | Ht 69.0 in | Wt 158.6 lb

## 2020-11-28 DIAGNOSIS — M2540 Effusion, unspecified joint: Secondary | ICD-10-CM | POA: Diagnosis not present

## 2020-11-28 DIAGNOSIS — M25511 Pain in right shoulder: Secondary | ICD-10-CM | POA: Diagnosis not present

## 2020-11-28 DIAGNOSIS — M7711 Lateral epicondylitis, right elbow: Secondary | ICD-10-CM | POA: Diagnosis not present

## 2020-11-28 MED ORDER — PREDNISONE 50 MG PO TABS: 50.0000 mg | ORAL_TABLET | Freq: Every day | ORAL | 0 refills | Status: DC

## 2020-11-28 MED ORDER — GABAPENTIN 300 MG PO CAPS: 300.0000 mg | ORAL_CAPSULE | Freq: Three times a day (TID) | ORAL | 3 refills | Status: DC | PRN

## 2020-11-28 NOTE — Patient Instructions (Addendum)
Thank you for coming in today.  Call or go to the ER if you develop a large red swollen joint with extreme pain or oozing puss.   I've referred you to Physical Therapy.  Let us know if you don't hear from them in one week.  Please use Voltaren gel (Generic Diclofenac Gel) up to 4x daily for pain as needed.  This is available over-the-counter as both the name brand Voltaren gel and the generic diclofenac gel.  Start the prednisone Sunday.   Ok to take the gabapentin now for nerve pain as needed.  It may make you sleepy.    Recheck in 1 month.

## 2020-12-04 ENCOUNTER — Encounter (HOSPITAL_BASED_OUTPATIENT_CLINIC_OR_DEPARTMENT_OTHER): Payer: Self-pay | Admitting: Physical Therapy

## 2020-12-04 ENCOUNTER — Other Ambulatory Visit: Payer: Self-pay

## 2020-12-04 ENCOUNTER — Ambulatory Visit (HOSPITAL_BASED_OUTPATIENT_CLINIC_OR_DEPARTMENT_OTHER): Payer: 59 | Attending: Family Medicine | Admitting: Physical Therapy

## 2020-12-04 DIAGNOSIS — M5412 Radiculopathy, cervical region: Secondary | ICD-10-CM | POA: Insufficient documentation

## 2020-12-04 DIAGNOSIS — R293 Abnormal posture: Secondary | ICD-10-CM | POA: Diagnosis present

## 2020-12-04 DIAGNOSIS — M62838 Other muscle spasm: Secondary | ICD-10-CM | POA: Insufficient documentation

## 2020-12-04 DIAGNOSIS — M25521 Pain in right elbow: Secondary | ICD-10-CM | POA: Diagnosis present

## 2020-12-04 DIAGNOSIS — M25541 Pain in joints of right hand: Secondary | ICD-10-CM | POA: Diagnosis present

## 2020-12-05 NOTE — Therapy (Signed)
[place], [place], [address] Phone: [phone]   Fax:  [phone]  Physical Therapy Evaluation  Patient Details  Name: Terrence Warner MRN: [id number] Date of Birth: [birth year] Referring Provider (PT): Dr Lynne Leader   Encounter Date: 12/04/2020   PT End of Session - 12/05/20 0830    Visit Number 1    Number of Visits 12    Date for PT Re-Evaluation 01/16/21    Authorization Type [employer] 2022    PT Start Time 0845    PT Stop Time 0926    PT Time Calculation (min) 41 min    Activity Tolerance Patient tolerated treatment well    Behavior During Therapy [facility] for tasks assessed/performed           History reviewed. No pertinent past medical history.  History reviewed. No pertinent surgical history.  There were no vitals filed for this visit.        [facility] PT Assessment - 12/05/20 0001      Assessment   Medical Diagnosis Right radicular pain/    Referring Provider (PT) Dr Lynne Leader    Onset Date/Surgical Date --   radicular symptoms began 3-4 weeks ago; pain started in February   Hand Dominance Right    Next MD Visit June 3rd    Prior Therapy For his hand last year      Precautions   Precautions None      Restrictions   Weight Bearing Restrictions No      Balance Screen   Has the patient fallen in the past 6 months No    Is the patient reluctant to leave their home because of a fear of falling?  No      Home Environment   Additional Comments has 3 children.      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    Leisure Basketball      Cognition   Overall Cognitive Status Within Functional Limits for tasks assessed    Attention Focused    Focused Attention Appears intact    Memory Appears intact    Awareness Appears intact    Problem Solving Appears intact       Observation/Other Assessments   Observations sits with rounded shoulders and forward head      Sensation   Light Touch Appears Intact    Additional Comments coming to a straight posture causes radicular symptoms      Coordination   Gross Motor Movements are Fluid and Coordinated Yes    Fine Motor Movements are Fluid and Coordinated Yes      Posture/Postural Control   Posture Comments rounded shoulders and forward head      ROM / Strength   AROM / PROM / Strength AROM;PROM;Strength      AROM   Overall AROM Comments elbow supination 30 degrees; painful active wrist extension    AROM Assessment Site Shoulder;Elbow;Cervical    Right/Left Shoulder Right;Left    Right Shoulder Flexion --   past 90 degrees the patients arm went numb   Right/Left Elbow Right    Cervical Flexion painfull    Cervical Extension painfull    Cervical - Right Rotation 40 with radiuclar pain    Cervical - Left Rotation 55 with pain on the right  Strength   Overall Strength Comments unable to assess 2nd to reactivity of symptoms    Strength Assessment Site Hand;Shoulder      Palpation   Palpation comment significant spasming in the right upper trap and into the right paraspinals; Tender to palpation in the lateral epicondyle      Special Tests   Other special tests spurlings and TOS tests not perfromed 2nd to increase radicular symptoms with basic active ROM of the shoulder and neck.                      Objective measurements completed on examination: See above findings.       [facility] Treatment/Exercise - 12/05/20 0001      Exercises   Exercises Neck;Elbow      Elbow Exercises   Other elbow exercises reviewed self supination stretch 3x20 sec hold    Other elbow exercises reviewed lateral epicondlye TFM      Manual Therapy   Manual Therapy Joint mobilization;Manual Traction;Soft tissue mobilization    Soft tissue mobilization trigger point release to upper trap     Manual Traction to cervical spine      Neck Exercises: Stretches   Upper Trapezius Stretch Limitations reviewed upper trap stretch 2x20 sec hold reported a stretch with mild increase in radiuclar symptoms                  PT Education - 12/05/20 0828    Education Details HEP and symptom mangement    Person(s) Educated Patient    Methods Explanation;Demonstration;Tactile cues;Verbal cues    Comprehension Verbalized understanding;Returned demonstration;Verbal cues required;Tactile cues required            PT Short Term Goals - 12/05/20 1710      PT SHORT TERM GOAL #1   Title Patient will increase cerival rotation on the right by 25 degrees    Time 3    Period Weeks    Status New    Target Date 12/26/20      PT SHORT TERM GOAL #2   Title Patient will demonstrate full elbow supination without pain    Time 3    Period Weeks    Status New    Target Date 12/26/20      PT SHORT TERM GOAL #3   Title Patient will demonstrate full shoulder PROM without radicular symptoms    Time 3    Period Weeks    Status New    Target Date 12/26/20             PT Long Term Goals - 12/05/20 1713      PT LONG TERM GOAL #1   Title Patient will perform work tasks without radicular symptoms and pain    Time 6    Period Weeks    Status New    Target Date 01/16/21      PT LONG TERM GOAL #2   Title Patient will return to basketball without increased pain    Time 6    Period Weeks    Status New    Target Date 01/16/21                  Plan - 12/05/20 6144    Clinical Impression Statement Patient is a 35 year old male with right shoulder pain, right lateral elbow, and right hand pain. he has deveoped significant radicuopathy. He has radicular symptoms when he cones out of a slouched position  and with any movement of his shoulder. It was hard to differetiate between cervical radiculopathy and TOS 2nd to him being so reactive to all movements. He had a positive spurlings but  even simple movements of his shoulder caused him radicular aymtoms. His elbow pain is consitent with lateral epicondylitis. He was given an HEP for the lateral epicondylitis. He was only iven a limited HEP for his shoulder 2nd to the reactiveness of his radicular symptoms with basic movements. He had a large trigger point in his uppert trap and into his cervical paraspinals. Therapy perfromed manual therapy to the area. he had a slight increase in radicular symptoms. He was advised to look for improvement as the day goes on. He would benefit from skilled therapy to improve.    Personal Factors and Comorbidities Comorbidity 1;Comorbidity 2    Comorbidities smoker; prior broke finger    Examination-Activity Limitations Sit;Sleep;Lift;Carry    Examination-Participation Restrictions Laundry;Yard Work;Community Activity;Cleaning;Occupation;Other    Stability/Clinical Decision Making Evolving/Moderate complexity    Clinical Decision Making Moderate    Rehab Potential Good    PT Frequency 2x / week    PT Duration 6 weeks    PT Treatment/Interventions ADLs/Self Care Home Management;Electrical Stimulation;Cryotherapy;Iontophoresis 4mg /ml Dexamethasone;Moist Heat;Traction;Ultrasound;Gait training;Stair training;Functional mobility training;Therapeutic activities;Therapeutic exercise;Neuromuscular re-education;Patient/family education;Manual techniques;Passive range of motion;Dry needling;Taping    PT Next Visit Plan continue with manual therapy to the upper trap and cervical spine; consider dry needling if indicated; begin postural correction when able ( increased symptoms on IE) consider ionto to lateral epicondyle; begin exercises when able; consider upper trap taping if tolerated;    PT Home Exercise Plan Access Code: [email or web address]    Prepared by: Carolyne Littles    Program Notes  rub easy across the muscles of the elbow  put pressure       Exercises  Seated Upper Trapezius Stretch - 1 x daily - 7 x weekly - 3 sets -  10 reps  Forearm Supination PROM - 1 x daily - 7 x weekly - 3 sets - 10 reps    Consulted and Agree with Plan of Care Patient           Patient will benefit from skilled therapeutic intervention in order to improve the following deficits and impairments:  Decreased range of motion,Increased fascial restricitons,Impaired UE functional use,Decreased safety awareness,Pain,Decreased activity tolerance,Decreased mobility,Decreased strength,Postural dysfunction,Improper body mechanics  Visit Diagnosis: Radiculopathy, cervical region  Pain in right elbow  Pain in joints of right hand  Other muscle spasm  Abnormal posture     Problem List Patient Active Problem List   Diagnosis Date Noted  . Traumatic synovial effusion 04/02/2020    Carney Living PT DPT  12/05/2020, 5:18 PM  [facility] [place], [place], [address] Phone: [phone]   Fax:  [phone]  Name: Terrence Warner MRN: [id number] Date of Birth: [birth year]

## 2020-12-05 NOTE — Patient Instructions (Signed)
Access Code: [email or web address] URL: [email or web address] Date: 12/05/2020 Prepared by: Carolyne Littles  Program Notes rub easy across the muscles of the elbow put pressure    Exercises Seated Upper Trapezius Stretch - 1 x daily - 7 x weekly - 3 sets - 10 reps Forearm Supination PROM - 1 x daily - 7 x weekly - 3 sets - 10 reps

## 2020-12-10 ENCOUNTER — Ambulatory Visit (HOSPITAL_BASED_OUTPATIENT_CLINIC_OR_DEPARTMENT_OTHER): Payer: 59 | Admitting: Physical Therapy

## 2020-12-10 ENCOUNTER — Other Ambulatory Visit: Payer: Self-pay

## 2020-12-10 ENCOUNTER — Encounter (HOSPITAL_BASED_OUTPATIENT_CLINIC_OR_DEPARTMENT_OTHER): Payer: Self-pay | Admitting: Physical Therapy

## 2020-12-10 DIAGNOSIS — M62838 Other muscle spasm: Secondary | ICD-10-CM

## 2020-12-10 DIAGNOSIS — M25541 Pain in joints of right hand: Secondary | ICD-10-CM

## 2020-12-10 DIAGNOSIS — M5412 Radiculopathy, cervical region: Secondary | ICD-10-CM | POA: Diagnosis not present

## 2020-12-10 DIAGNOSIS — R293 Abnormal posture: Secondary | ICD-10-CM

## 2020-12-10 DIAGNOSIS — M25521 Pain in right elbow: Secondary | ICD-10-CM

## 2020-12-10 NOTE — Therapy (Signed)
[facility] [address] [place], [place], [address] Phone: [phone]   Fax:  [phone]  Physical Therapy Treatment  Patient Details  Name: ANTONEO GHRIST MRN: [id number] Date of Birth: [birth year] Referring Provider (PT): Dr Lynne Leader   Encounter Date: 12/10/2020   PT End of Session - 12/10/20 1434    Visit Number 2    Number of Visits 12    Date for PT Re-Evaluation 01/16/21    Authorization Type UHC 2022    PT Start Time 2951    PT Stop Time 1515    PT Time Calculation (min) 43 min    Activity Tolerance Patient tolerated treatment well    Behavior During Therapy [facility] for tasks assessed/performed           History reviewed. No pertinent past medical history.  History reviewed. No pertinent surgical history.  There were no vitals filed for this visit.   Subjective Assessment - 12/10/20 1435    Subjective unable to find a comfort position that is consistent. pain worsens as the week progresses.    Patient Stated Goals to get back to basketball    Currently in Pain? Yes    Pain Score 8     Pain Location Shoulder    Pain Orientation Right    Pain Descriptors / Indicators Aching    Aggravating Factors  inconsistent    Pain Relieving Factors unknown                             [facility] Adult PT Treatment/Exercise - 12/10/20 0001      Manual Therapy   Manual therapy comments skilled palpation and monitoring during TPDN    Soft tissue mobilization to all muscles listed under DN            Trigger Point Dry Needling - 12/10/20 0001    Consent Given? Yes    Education Handout Provided --   verbal education   Muscles Treated Head and Neck Upper trapezius;Levator scapulae    Muscles Treated Upper Quadrant Rhomboids    Other Dry Needling wrist extensor group on Rt, abductor pollicis brevis    Upper Trapezius Response Twitch reponse elicited;Palpable increased muscle length   Rt   Levator Scapulae  Response Twitch response elicited;Palpable increased muscle length   Rt   Rhomboids Response Twitch response elicited;Palpable increased muscle length   Rt               PT Education - 12/10/20 2009    Education Details TPDN and expected outcomes, anatomy of condition    Person(s) Educated Patient    Methods Explanation    Comprehension Verbalized understanding;Need further instruction            PT Short Term Goals - 12/05/20 1710      PT SHORT TERM GOAL #1   Title Patient will increase cerival rotation on the right by 25 degrees    Time 3    Period Weeks    Status New    Target Date 12/26/20      PT SHORT TERM GOAL #2   Title Patient will demonstrate full elbow supination without pain    Time 3    Period Weeks    Status New    Target Date 12/26/20      PT SHORT TERM GOAL #3   Title Patient will demonstrate full shoulder PROM without radicular symptoms  Time 3    Period Weeks    Status New    Target Date 12/26/20             PT Long Term Goals - 12/05/20 1713      PT LONG TERM GOAL #1   Title Patient will perform work tasks without radicular symptoms and pain    Time 6    Period Weeks    Status New    Target Date 01/16/21      PT LONG TERM GOAL #2   Title Patient will return to basketball without increased pain    Time 6    Period Weeks    Status New    Target Date 01/16/21                 Plan - 12/10/20 1516    Clinical Impression Statement Concordant pain referred proximally upon palpation to thumb muscles so DN was utilized to decrease- following DN, concordant pain was not recreated upon testing of carpal tunnel. in prone visualized thoracic levoscoliosis with atrophy of infraspinatus. Was able to activate so likely due to lack of use.    PT Treatment/Interventions ADLs/Self Care Home Management;Electrical Stimulation;Cryotherapy;Iontophoresis 4mg /ml Dexamethasone;Moist Heat;Traction;Ultrasound;Gait training;Stair training;Functional  mobility training;Therapeutic activities;Therapeutic exercise;Neuromuscular re-education;Patient/family education;Manual techniques;Passive range of motion;Dry needling;Taping    PT Next Visit Plan outcome of DN??    PT Home Exercise Plan Access Code: [email or web address]    Consulted and Agree with Plan of Care Patient           Patient will benefit from skilled therapeutic intervention in order to improve the following deficits and impairments:  Decreased range of motion,Increased fascial restricitons,Impaired UE functional use,Decreased safety awareness,Pain,Decreased activity tolerance,Decreased mobility,Decreased strength,Postural dysfunction,Improper body mechanics  Visit Diagnosis: Radiculopathy, cervical region  Pain in right elbow  Pain in joints of right hand  Other muscle spasm  Abnormal posture     Problem List Patient Active Problem List   Diagnosis Date Noted  . Traumatic synovial effusion 04/02/2020   Launi Asencio C. Yanely Mast PT, DPT 12/10/20 8:13 PM   [facility] [place], [place], [address] Phone: [phone]   Fax:  [phone]  Name: Terrence Warner MRN: [id number] Date of Birth: [birth year]

## 2020-12-17 ENCOUNTER — Encounter (HOSPITAL_BASED_OUTPATIENT_CLINIC_OR_DEPARTMENT_OTHER): Payer: Self-pay | Admitting: Physical Therapy

## 2020-12-17 ENCOUNTER — Ambulatory Visit (HOSPITAL_BASED_OUTPATIENT_CLINIC_OR_DEPARTMENT_OTHER): Payer: 59 | Admitting: Physical Therapy

## 2020-12-17 ENCOUNTER — Other Ambulatory Visit: Payer: Self-pay

## 2020-12-17 DIAGNOSIS — M25521 Pain in right elbow: Secondary | ICD-10-CM

## 2020-12-17 DIAGNOSIS — M5412 Radiculopathy, cervical region: Secondary | ICD-10-CM | POA: Diagnosis not present

## 2020-12-17 DIAGNOSIS — M62838 Other muscle spasm: Secondary | ICD-10-CM

## 2020-12-17 DIAGNOSIS — M25541 Pain in joints of right hand: Secondary | ICD-10-CM

## 2020-12-17 NOTE — Therapy (Signed)
[facility] [place], [place], [address] Phone: [phone]   Fax:  [phone]  Physical Therapy Treatment  Patient Details  Name: Terrence Warner MRN: [id number] Date of Birth: 03-31-1986 Referring Provider (PT): Dr Lynne Leader   Encounter Date: 12/17/2020   PT End of Session - 12/17/20 1544    Visit Number 3    Number of Visits 12    Date for PT Re-Evaluation 01/16/21    Authorization Type UHC 2022    PT Start Time 0800    PT Stop Time 0843    PT Time Calculation (min) 43 min    Activity Tolerance Patient tolerated treatment well    Behavior During Therapy [facility] for tasks assessed/performed           History reviewed. No pertinent past medical history.  History reviewed. No pertinent surgical history.  There were no vitals filed for this visit.   Subjective Assessment - 12/17/20 0854    Subjective Patient reports the needling helped for about 2 days then it went back to about the same. His elbow is about the same as well. his arm is numb this morning. He threw something tomorrow.    Pertinent History smoker    Limitations Standing;Walking    Diagnostic tests [place]:    Patient Stated Goals to get back to basketball    Currently in Pain? Yes    Pain Score 7     Pain Location Shoulder    Pain Orientation Right    Pain Descriptors / Indicators Aching    Pain Type Chronic pain    Pain Onset 1 to 4 weeks ago    Aggravating Factors  arm positioning    Pain Relieving Factors unknown    Effect of Pain on Daily Activities difficulty    Multiple Pain Sites Yes    Pain Score 7    Pain Location Elbow    Pain Orientation Right    Pain Descriptors / Indicators Aching    Pain Type Chronic pain    Pain Onset 1 to 4 weeks ago    Pain Frequency Constant    Aggravating Factors  sitting straight    Pain Relieving Factors rest    Effect of Pain on Daily Activities difficulty perfroming ADL's                              [facility] Adult PT Treatment/Exercise - 12/17/20 0001      Neck Exercises: Seated   Other Seated Exercise sitting in proper posture 2x10      Manual Therapy   Manual therapy comments skilled palpation and monitoring during TPDN    Soft tissue mobilization to all muscles listed under DN using IASTYM    Manual Traction to cervical spine      Neck Exercises: Stretches   Upper Trapezius Stretch Limitations reviewed upper trap stretch 2x20 sec hold reported a stretch with mild increase in radiuclar symptoms                  PT Education - 12/17/20 1541    Education Details reviewed exercise for posture    Person(s) Educated Patient    Methods Explanation;Demonstration;Tactile cues;Verbal cues    Comprehension Verbalized understanding;Returned demonstration;Verbal cues required;Tactile cues required            PT Short Term Goals - 12/05/20 1710      PT SHORT TERM GOAL #  1   Title Patient will increase cerival rotation on the right by 25 degrees    Time 3    Period Weeks    Status New    Target Date 12/26/20      PT SHORT TERM GOAL #2   Title Patient will demonstrate full elbow supination without pain    Time 3    Period Weeks    Status New    Target Date 12/26/20      PT SHORT TERM GOAL #3   Title Patient will demonstrate full shoulder PROM without radicular symptoms    Time 3    Period Weeks    Status New    Target Date 12/26/20             PT Long Term Goals - 12/05/20 1713      PT LONG TERM GOAL #1   Title Patient will perform work tasks without radicular symptoms and pain    Time 6    Period Weeks    Status New    Target Date 01/16/21      PT LONG TERM GOAL #2   Title Patient will return to basketball without increased pain    Time 6    Period Weeks    Status New    Target Date 01/16/21                 Plan - 12/17/20 1547    Clinical Impression Statement Patients symptoms have not changed much,  but his tolerance to manual therapy and postrual correction has improved. We focused on the neck today and spasming of the upper trap. We will address the elbow but, it is likley the elbow is tied into the lack of use of his shoulder. He tolerated more pressure with triggr point release. We will continue to progress as tolerated.    Personal Factors and Comorbidities Comorbidity 1;Comorbidity 2    Comorbidities smoker; prior broke finger    Examination-Activity Limitations Sit;Sleep;Lift;Carry    Examination-Participation Restrictions Laundry;Yard Work;Community Activity;Cleaning;Occupation;Other    Stability/Clinical Decision Making Evolving/Moderate complexity    Clinical Decision Making Moderate    Rehab Potential Good    PT Frequency 2x / week    PT Duration 6 weeks    PT Treatment/Interventions ADLs/Self Care Home Management;Electrical Stimulation;Cryotherapy;Iontophoresis 4mg /ml Dexamethasone;Moist Heat;Traction;Ultrasound;Gait training;Stair training;Functional mobility training;Therapeutic activities;Therapeutic exercise;Neuromuscular re-education;Patient/family education;Manual techniques;Passive range of motion;Dry needling;Taping    PT Next Visit Plan outcome of DN?? consider adding light resitance to ther-ex. Review elbow exercises if time permit    PT Home Exercise Plan Access Code: [email or web address]    Consulted and Agree with Plan of Care Patient           Patient will benefit from skilled therapeutic intervention in order to improve the following deficits and impairments:  Decreased range of motion,Increased fascial restricitons,Impaired UE functional use,Decreased safety awareness,Pain,Decreased activity tolerance,Decreased mobility,Decreased strength,Postural dysfunction,Improper body mechanics  Visit Diagnosis: Radiculopathy, cervical region  Pain in joints of right hand  Pain in right elbow  Other muscle spasm     Problem List Patient Active Problem List   Diagnosis Date  Noted  . Traumatic synovial effusion 04/02/2020    Carney Living PT DPT  12/17/2020, 4:24 PM  [facility] [address] [place], [place], [address] Phone: [phone]   Fax:  [phone]  Name: Terrence Warner MRN: [id number] Date of Birth: January 30, 1986

## 2020-12-19 ENCOUNTER — Ambulatory Visit (HOSPITAL_BASED_OUTPATIENT_CLINIC_OR_DEPARTMENT_OTHER): Payer: 59 | Admitting: Physical Therapy

## 2020-12-19 ENCOUNTER — Other Ambulatory Visit: Payer: Self-pay

## 2020-12-19 ENCOUNTER — Encounter (HOSPITAL_BASED_OUTPATIENT_CLINIC_OR_DEPARTMENT_OTHER): Payer: Self-pay | Admitting: Physical Therapy

## 2020-12-19 DIAGNOSIS — R293 Abnormal posture: Secondary | ICD-10-CM

## 2020-12-19 DIAGNOSIS — M62838 Other muscle spasm: Secondary | ICD-10-CM

## 2020-12-19 DIAGNOSIS — M25541 Pain in joints of right hand: Secondary | ICD-10-CM

## 2020-12-19 DIAGNOSIS — M25521 Pain in right elbow: Secondary | ICD-10-CM

## 2020-12-19 DIAGNOSIS — M5412 Radiculopathy, cervical region: Secondary | ICD-10-CM

## 2020-12-19 NOTE — Therapy (Signed)
[facility] [place], [place], [address] Phone: [phone]   Fax:  [phone]  Physical Therapy Treatment  Patient Details  Name: Terrence Warner MRN: [id number] Date of Birth: 01/25/1986 Referring Provider (PT): Dr Lynne Leader   Encounter Date: 12/19/2020   PT End of Session - 12/19/20 1033    Visit Number 4    Number of Visits 12    Date for PT Re-Evaluation 01/16/21    Authorization Type UHC 2022    PT Start Time 0800    PT Stop Time 0844    PT Time Calculation (min) 44 min    Activity Tolerance Patient tolerated treatment well    Behavior During Therapy [facility] for tasks assessed/performed           History reviewed. No pertinent past medical history.  History reviewed. No pertinent surgical history.  There were no vitals filed for this visit.   Subjective Assessment - 12/19/20 1027    Subjective Patient reported feeling a little looser after the last visit but he was very sore. Over the week the patients pain has had progressive tightness in his shoulders.    Pertinent History smoker    Limitations Standing;Walking    Diagnostic tests [place]:    Patient Stated Goals to get back to basketball    Currently in Pain? Yes    Pain Score 7     Pain Location Shoulder    Pain Orientation Right    Pain Descriptors / Indicators Aching    Pain Type Chronic pain    Pain Radiating Towards whole arm goes numb    Pain Onset 1 to 4 weeks ago    Pain Frequency Constant    Aggravating Factors  arm positioning    Pain Relieving Factors unknown    Effect of Pain on Daily Activities difficulty using HEP                             [facility] Adult PT Treatment/Exercise - 12/19/20 0001      Neck Exercises: Standing   Other Standing Exercises scap retraction to neutral x10 oarnge      Modalities   Modalities Iontophoresis      Iontophoresis   Type of Iontophoresis Dexamethasone    Location right anterior  shoulder    Dose 1cc    Time 1 min      Manual Therapy   Manual Therapy Taping    Manual therapy comments skilled palpation and monitoring during TPDN    Soft tissue mobilization to all muscles listed under DN using IASTYM    Manual Traction to cervical spine    McConnell decompression taping of the right upper trap      Neck Exercises: Stretches   Upper Trapezius Stretch Limitations reviewed upper trap stretch 2x20 sec hold reported a stretch with mild increase in radiuclar symptoms                  PT Education - 12/19/20 1032    Education Details updated HEP; benefits and risk of ionto; benefits and risks of taping    Person(s) Educated Patient    Methods Explanation;Demonstration;Tactile cues;Verbal cues    Comprehension Returned demonstration;Tactile cues required;Verbal cues required;Verbalized understanding            PT Short Term Goals - 12/05/20 1710      PT SHORT TERM GOAL #1   Title Patient will  increase cerival rotation on the right by 25 degrees    Time 3    Period Weeks    Status New    Target Date 12/26/20      PT SHORT TERM GOAL #2   Title Patient will demonstrate full elbow supination without pain    Time 3    Period Weeks    Status New    Target Date 12/26/20      PT SHORT TERM GOAL #3   Title Patient will demonstrate full shoulder PROM without radicular symptoms    Time 3    Period Weeks    Status New    Target Date 12/26/20             PT Long Term Goals - 12/05/20 1713      PT LONG TERM GOAL #1   Title Patient will perform work tasks without radicular symptoms and pain    Time 6    Period Weeks    Status New    Target Date 01/16/21      PT LONG TERM GOAL #2   Title Patient will return to basketball without increased pain    Time 6    Period Weeks    Status New    Target Date 01/16/21                 Plan - 12/19/20 1231    Clinical Impression Statement Patient continues to be sore with minimal activity. The  patient was given light strengthening today. We also tried taping anfd ionto phoriesis to his anterior shoulder. his posture is improving but his symptoms are still very reactive. We will continue next week then send patient back to his MD if he is not having any improvement. We also needled his scaleene today.    Personal Factors and Comorbidities Comorbidity 1;Comorbidity 2    Comorbidities smoker; prior broke finger    Examination-Activity Limitations Sit;Sleep;Lift;Carry    Examination-Participation Restrictions Laundry;Yard Work;Community Activity;Cleaning;Occupation;Other    Stability/Clinical Decision Making Evolving/Moderate complexity    Clinical Decision Making Moderate    Rehab Potential Good    PT Frequency 2x / week    PT Duration 6 weeks    PT Treatment/Interventions ADLs/Self Care Home Management;Electrical Stimulation;Cryotherapy;Iontophoresis 4mg /ml Dexamethasone;Moist Heat;Traction;Ultrasound;Gait training;Stair training;Functional mobility training;Therapeutic activities;Therapeutic exercise;Neuromuscular re-education;Patient/family education;Manual techniques;Passive range of motion;Dry needling;Taping    PT Next Visit Plan outcome of DN?? consider adding light resitance to ther-ex. Review elbow exercises if time permit    PT Home Exercise Plan Access Code: [email or web address]    Consulted and Agree with Plan of Care Patient           Patient will benefit from skilled therapeutic intervention in order to improve the following deficits and impairments:  Decreased range of motion,Increased fascial restricitons,Impaired UE functional use,Decreased safety awareness,Pain,Decreased activity tolerance,Decreased mobility,Decreased strength,Postural dysfunction,Improper body mechanics  Visit Diagnosis: Radiculopathy, cervical region  Pain in joints of right hand  Pain in right elbow  Other muscle spasm  Abnormal posture     Problem List Patient Active Problem List   Diagnosis  Date Noted  . Traumatic synovial effusion 04/02/2020    Carney Living PT DPT  12/19/2020, 12:34 PM  [facility] [address] [place], [place], [address] Phone: [phone]   Fax:  [phone]  Name: FARIS COOLMAN MRN: [id number] Date of Birth: 01/03/1986

## 2020-12-22 ENCOUNTER — Encounter (HOSPITAL_BASED_OUTPATIENT_CLINIC_OR_DEPARTMENT_OTHER): Payer: Self-pay | Admitting: Physical Therapy

## 2020-12-22 ENCOUNTER — Other Ambulatory Visit: Payer: Self-pay

## 2020-12-22 ENCOUNTER — Ambulatory Visit (HOSPITAL_BASED_OUTPATIENT_CLINIC_OR_DEPARTMENT_OTHER): Payer: 59 | Admitting: Physical Therapy

## 2020-12-22 DIAGNOSIS — M25521 Pain in right elbow: Secondary | ICD-10-CM

## 2020-12-22 DIAGNOSIS — R293 Abnormal posture: Secondary | ICD-10-CM

## 2020-12-22 DIAGNOSIS — M5412 Radiculopathy, cervical region: Secondary | ICD-10-CM | POA: Diagnosis not present

## 2020-12-22 DIAGNOSIS — M62838 Other muscle spasm: Secondary | ICD-10-CM

## 2020-12-22 DIAGNOSIS — M25541 Pain in joints of right hand: Secondary | ICD-10-CM

## 2020-12-22 NOTE — Therapy (Signed)
[facility] [place], [place], [address] Phone: [phone]   Fax:  [phone]  Physical Therapy Treatment  Patient Details  Name: Terrence Warner MRN: [id number] Date of Birth: Mar 31, 1986 Referring Provider (PT): Dr Lynne Leader   Encounter Date: 12/22/2020   PT End of Session - 12/22/20 0851    Visit Number 5    Number of Visits 12    Date for PT Re-Evaluation 01/16/21    Authorization Type UHC 2022    PT Start Time 0801    PT Stop Time 0845    PT Time Calculation (min) 44 min    Activity Tolerance Patient tolerated treatment well    Behavior During Therapy [facility] for tasks assessed/performed           History reviewed. No pertinent past medical history.  History reviewed. No pertinent surgical history.  There were no vitals filed for this visit.   Subjective Assessment - 12/22/20 0839    Subjective Patient reports Friday his pain was signiifcant. Over the weekedn the pain wasn;t too bad but it continues to go numb quickly.    Pertinent History smoker    Limitations Standing;Walking    Diagnostic tests [place]:    Patient Stated Goals to get back to basketball    Currently in Pain? Yes    Pain Score 6     Pain Location Shoulder    Pain Orientation Right    Pain Descriptors / Indicators Aching;Burning;Numbness    Pain Type Chronic pain    Pain Onset 1 to 4 weeks ago    Pain Frequency Constant    Aggravating Factors  any type of positioning    Pain Relieving Factors unknown    Effect of Pain on Daily Activities difficulty using HEP                             [facility] Adult PT Treatment/Exercise - 12/22/20 0001      Neck Exercises: Standing   Other Standing Exercises scap retraction to neutral x20 yellow; shoulder extension yellow x15; patient reported he could feel some tension and numbness with basic exercises.      Iontophoresis   Type of Iontophoresis Dexamethasone    Location right  anterior shoulder    Dose 1cc    Time 1 min      Manual Therapy   Manual Therapy Taping    Manual therapy comments skilled palpation and monitoring during TPDN    Soft tissue mobilization to all muscles listed under DN using IASTYM    Manual Traction to cervical spine    McConnell decompression taping of the right upper trap            Trigger Point Dry Needling - 12/22/20 0001    Consent Given? Yes    Muscles Treated Back/Hip Thoracic multifidi    Other Dry Needling 2 spots in thre thoriaci multifidi around T-3/t-4 ; 2 spots int he right upper trap and 1 in the levator all with a .30 50 needle    Upper Trapezius Response Twitch reponse elicited;Palpable increased muscle length    Levator Scapulae Response Twitch response elicited;Palpable increased muscle length    Thoracic multifidi response Palpable increased muscle length                PT Education - 12/22/20 0842    Education Details reviewed posture and updated HEP    Person(s) Educated  Patient    Methods Explanation;Demonstration;Tactile cues;Verbal cues    Comprehension Verbalized understanding;Returned demonstration;Verbal cues required;Tactile cues required            PT Short Term Goals - 12/05/20 1710      PT SHORT TERM GOAL #1   Title Patient will increase cerival rotation on the right by 25 degrees    Time 3    Period Weeks    Status New    Target Date 12/26/20      PT SHORT TERM GOAL #2   Title Patient will demonstrate full elbow supination without pain    Time 3    Period Weeks    Status New    Target Date 12/26/20      PT SHORT TERM GOAL #3   Title Patient will demonstrate full shoulder PROM without radicular symptoms    Time 3    Period Weeks    Status New    Target Date 12/26/20             PT Long Term Goals - 12/05/20 1713      PT LONG TERM GOAL #1   Title Patient will perform work tasks without radicular symptoms and pain    Time 6    Period Weeks    Status New     Target Date 01/16/21      PT LONG TERM GOAL #2   Title Patient will return to basketball without increased pain    Time 6    Period Weeks    Status New    Target Date 01/16/21                 Plan - 12/22/20 0851    Clinical Impression Statement The pateint continues to have limited tolerance to light ther-ex. He has increased numbness with basic scap stability. His spasming has improved significanty. He has improved tolaernce to manual therapy in his upper trap and cervical spine. The numbness continues to stay about the same. He felt some improvement with taping and ionto. We will continuewith those things as tolerated.    Personal Factors and Comorbidities Comorbidity 1;Comorbidity 2    Comorbidities smoker; prior broke finger    Examination-Participation Restrictions Laundry;Yard Work;Community Activity;Cleaning;Occupation;Other    Stability/Clinical Decision Making Evolving/Moderate complexity    Clinical Decision Making Moderate    Rehab Potential Good    PT Frequency 2x / week    PT Duration 6 weeks    PT Treatment/Interventions ADLs/Self Care Home Management;Electrical Stimulation;Cryotherapy;Iontophoresis 4mg /ml Dexamethasone;Moist Heat;Traction;Ultrasound;Gait training;Stair training;Functional mobility training;Therapeutic activities;Therapeutic exercise;Neuromuscular re-education;Patient/family education;Manual techniques;Passive range of motion;Dry needling;Taping    PT Next Visit Plan outcome of DN?? consider adding light resitance to ther-ex. Review elbow exercises if time permit    PT Home Exercise Plan Access Code: [email or web address]    Consulted and Agree with Plan of Care Patient           Patient will benefit from skilled therapeutic intervention in order to improve the following deficits and impairments:  Decreased range of motion,Increased fascial restricitons,Impaired UE functional use,Decreased safety awareness,Pain,Decreased activity tolerance,Decreased  mobility,Decreased strength,Postural dysfunction,Improper body mechanics  Visit Diagnosis: Radiculopathy, cervical region  Pain in joints of right hand  Pain in right elbow  Other muscle spasm  Abnormal posture     Problem List Patient Active Problem List   Diagnosis Date Noted  . Traumatic synovial effusion 04/02/2020    Carney Living PT DPT  12/22/2020, 9:10 AM  [facility]  [place], [place], [address] Phone: [phone]   Fax:  [phone]  Name: Terrence Warner MRN: [id number] Date of Birth: 29-Mar-1986

## 2020-12-24 ENCOUNTER — Ambulatory Visit (INDEPENDENT_AMBULATORY_CARE_PROVIDER_SITE_OTHER): Payer: 59

## 2020-12-24 ENCOUNTER — Other Ambulatory Visit: Payer: Self-pay

## 2020-12-24 ENCOUNTER — Ambulatory Visit (HOSPITAL_BASED_OUTPATIENT_CLINIC_OR_DEPARTMENT_OTHER): Payer: 59 | Admitting: Physical Therapy

## 2020-12-24 ENCOUNTER — Ambulatory Visit: Payer: 59 | Admitting: Family Medicine

## 2020-12-24 VITALS — BP 160/110 | HR 77 | Ht 69.0 in | Wt 159.2 lb

## 2020-12-24 DIAGNOSIS — M5412 Radiculopathy, cervical region: Secondary | ICD-10-CM

## 2020-12-24 DIAGNOSIS — M25511 Pain in right shoulder: Secondary | ICD-10-CM

## 2020-12-24 DIAGNOSIS — R293 Abnormal posture: Secondary | ICD-10-CM

## 2020-12-24 DIAGNOSIS — M25541 Pain in joints of right hand: Secondary | ICD-10-CM

## 2020-12-24 DIAGNOSIS — M25521 Pain in right elbow: Secondary | ICD-10-CM

## 2020-12-24 DIAGNOSIS — M62838 Other muscle spasm: Secondary | ICD-10-CM

## 2020-12-24 MED ORDER — PREDNISONE 50 MG PO TABS: 50.0000 mg | ORAL_TABLET | Freq: Every day | ORAL | 0 refills | Status: DC

## 2020-12-24 NOTE — Patient Instructions (Addendum)
Thank you for coming in today.  Pause PT for now.   Please get an Xray today before you leave  You should hear from MRI scheduling within 1 week. If you do not hear please let me know.   Recheck after the MRI.   Redo the prednisone.   Gabapentin remains as needed.

## 2020-12-24 NOTE — Therapy (Addendum)
[facility] [address] [place], [place], [address] Phone: [phone]   Fax:  [phone]  Physical Therapy Treatment/discharge   Patient Details  Name: Terrence Warner MRN: [id number] Date of Birth: 1986-05-15 Referring Provider (PT): Dr Lynne Leader   Encounter Date: 12/24/2020   PT End of Session - 12/24/20 1634     Visit Number 6    Number of Visits 12    Date for PT Re-Evaluation 01/16/21    Authorization Type UHC 2022    PT Start Time 0800    PT Stop Time 0840    PT Time Calculation (min) 40 min    Activity Tolerance Patient tolerated treatment well    Behavior During Therapy [facility] for tasks assessed/performed             No past medical history on file.  No past surgical history on file.  There were no vitals filed for this visit.   Subjective Assessment - 12/24/20 1630     Subjective Patient reported improved pain after the last visit although he did not go to work. he just went home and relaxed. The next day his pain came back signifcantly. He had difficulty finishing work tasks. He continues to have pain today. He reports his arm went numb 6x on the drive over here.    Pertinent History smoker    Limitations Standing;Walking    Diagnostic tests [place]:    Patient Stated Goals to get back to basketball    Currently in Pain? Yes    Pain Score 7     Pain Location Neck    Pain Orientation Right    Pain Descriptors / Indicators Aching    Pain Type Chronic pain    Pain Radiating Towards arm goes numb    Pain Onset 1 to 4 weeks ago    Pain Frequency Constant    Aggravating Factors  any type of arm positioning    Pain Relieving Factors unknown    Effect of Pain on Daily Activities unable to perfrom work tasks                [facility] PT Assessment - 12/24/20 0001       AROM   Cervical - Right Rotation 55    Cervical - Left Rotation 48                           [facility] Adult PT  Treatment/Exercise - 12/24/20 0001       Manual Therapy   Manual Therapy Taping    Manual therapy comments skilled palpation and monitoring during TPDN    Soft tissue mobilization to all muscles listed under DN using IASTYM; IATYM to lateral epicondyle; Radial head mobilization.    Manual Traction to cervical spine                    PT Education - 12/24/20 1632     Education Details retrun to MD    Person(s) Educated Patient    Methods Explanation;Demonstration;Tactile cues;Verbal cues    Comprehension Verbalized understanding;Returned demonstration;Verbal cues required;Tactile cues required              PT Short Term Goals - 12/05/20 1710       PT SHORT TERM GOAL #1   Title Patient will increase cerival rotation on the right by 25 degrees    Time 3    Period Weeks  Status New    Target Date 12/26/20      PT SHORT TERM GOAL #2   Title Patient will demonstrate full elbow supination without pain    Time 3    Period Weeks    Status New    Target Date 12/26/20      PT SHORT TERM GOAL #3   Title Patient will demonstrate full shoulder PROM without radicular symptoms    Time 3    Period Weeks    Status New    Target Date 12/26/20               PT Long Term Goals - 12/05/20 1713       PT LONG TERM GOAL #1   Title Patient will perform work tasks without radicular symptoms and pain    Time 6    Period Weeks    Status New    Target Date 01/16/21      PT LONG TERM GOAL #2   Title Patient will return to basketball without increased pain    Time 6    Period Weeks    Status New    Target Date 01/16/21                   Plan - 12/24/20 1634     Clinical Impression Statement The patient has not made progress. He continutes to have significant numbness. Therapy has been able to put more pressure into the muscle. He appears to have improved tolerance to manual therapy, but his symptoms have not improved. His elbow motion has improved  although his pain level has not. We will send him back to the MD for further follow up. He has an appointment next week. He was advised to call and see if he can get in quicker. We will proceed per MD reccoemndation.    Personal Factors and Comorbidities Comorbidity 1;Comorbidity 2    Comorbidities smoker; prior broke finger    Examination-Activity Limitations Sit;Sleep;Lift;Carry    Examination-Participation Restrictions Laundry;Yard Work;Community Activity;Cleaning;Occupation;Other    Stability/Clinical Decision Making Evolving/Moderate complexity    Clinical Decision Making Moderate    Rehab Potential Good    PT Frequency 2x / week    PT Duration 6 weeks    PT Treatment/Interventions ADLs/Self Care Home Management;Electrical Stimulation;Cryotherapy;Iontophoresis 61m/ml Dexamethasone;Moist Heat;Traction;Ultrasound;Gait training;Stair training;Functional mobility training;Therapeutic activities;Therapeutic exercise;Neuromuscular re-education;Patient/family education;Manual techniques;Passive range of motion;Dry needling;Taping    PT Next Visit Plan outcome of DN?? consider adding light resitance to ther-ex. Review elbow exercises if time permit    PT Home Exercise Plan Access Code: [email or web address]   Consulted and Agree with Plan of Care Patient             Patient will benefit from skilled therapeutic intervention in order to improve the following deficits and impairments:  Decreased range of motion,Increased fascial restricitons,Impaired UE functional use,Decreased safety awareness,Pain,Decreased activity tolerance,Decreased mobility,Decreased strength,Postural dysfunction,Improper body mechanics  Visit Diagnosis: Radiculopathy, cervical region  Pain in joints of right hand  Pain in right elbow  Other muscle spasm  Abnormal posture  PHYSICAL THERAPY DISCHARGE SUMMARY  Visits from Start of Care: 6  Current functional level related to goals / functional outcomes: Continued to have  severe pain patient sent back to MD for follow up    Remaining deficits: Unknown    Education / Equipment: HEP    Patient agrees to discharge. Patient goals were not met. Patient is being discharged due to did not respond to therapy.  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

## 2020-12-24 NOTE — Progress Notes (Signed)
I, Peterson Lombard, LAT, ATC acting as a scribe for Lynne Leader, MD.  Terrence Warner is a 35 y.o. male who presents to [place] at [facility] today for f/u R arm pain; R shoulder, elbow, and hand. Pt was last seen by Dr. Georgina Snell on 11/28/20 and was given a R subacromial steroid injection and was referred to PT of which he's completed 6 visits. Today, pt reports he came back a week early because he has not been doing well. Pt notes no improvement in pain and he cont to experience numbness. Pt cont to have decreased ROM. Pt notes on days that he has PT he has to accommodate his work schedule after having a session. Pt notes arm is going numb when driving.   Dx imaging: 09/19/20 R hand XR  03/21/20 R hand XR  Pertinent review of systems: No fevers or chills  Relevant historical information: History traumatic synovitis   Exam:  BP (!) 160/110   Pulse 77   Ht 5\' 9"  (1.753 m)   Wt 159 lb 3.2 oz (72.2 kg)   SpO2 98%   BMI 23.51 kg/m  General: Well Developed, well nourished, and in no acute distress.   MSK: C-spine normal-appearing nontender midline decreased cervical motion positive right-sided Spurling's test. Upper strength right shoulder abduction and right tricep extension.  Otherwise upper extremity strength is intact. Reflexes are intact.  Sensation is intact throughout.  Right shoulder normal-appearing nontender Decreased range of motion to abduction and internal rotation. Positive Hawkins and Neer's test.   Weakness abduction.   Lab and Radiology Results  X-ray images right shoulder and C-spine obtained today personally and independently interpreted  Right shoulder: No acute fractures.  No significant degenerative changes.  C-spine: Lack of cervical lordosis indicates muscle spasm.  Cervical DDD seen at C6-7  Await formal radiology review    Assessment and Plan: 35 y.o. male with right arm pain thought predominantly due to cervical radiculopathy.   Symptoms more consistent with C7 dermatomal pattern at this point.  He has associated triceps weakness which fits with dermatomal pattern.  His symptoms are becoming more severe not improved with conservative management and not responding well to prednisone and gabapentin.  Plan to proceed with an MRI for potential epidural steroid injection planning.  Recheck after MRI.  Additionally however he has shoulder pain that is also consistent with subacromial bursitis and rotator cuff tendinopathy.  This is not improving with physical therapy and with subacromial injection.  It appears independent to cervical radiculopathy.  Plan for MRI of this issue as well for potential surgical planning.  Both issues are significant and interfering with work and are not significantly effective quality of life.   PDMP not reviewed this encounter. Orders Placed This Encounter  Procedures  . DG Cervical Spine 2 or 3 views    Standing Status:   Future    Number of Occurrences:   1    Standing Expiration Date:   12/24/2021    Order Specific Question:   Reason for Exam (SYMPTOM  OR DIAGNOSIS REQUIRED)    Answer:   eval rt arm cervical radicuopathy    Order Specific Question:   Preferred imaging location?    Answer:   [facility]  . DG Shoulder Right    Standing Status:   Future    Number of Occurrences:   1    Standing Expiration Date:   12/24/2021    Order Specific Question:   Reason  for Exam (SYMPTOM  OR DIAGNOSIS REQUIRED)    Answer:   eval shoudler pain r    Order Specific Question:   Preferred imaging location?    Answer:   [facility]  . MR CERVICAL SPINE WO CONTRAST    Standing Status:   Future    Standing Expiration Date:   12/24/2021    Order Specific Question:   What is the patient's sedation requirement?    Answer:   No Sedation    Order Specific Question:   Does the patient have a pacemaker or implanted devices?    Answer:   No    Order Specific Question:   Preferred imaging  location?    Answer:   [facility] (table limit-350lbs)  . MR SHOULDER RIGHT WO CONTRAST    Standing Status:   Future    Standing Expiration Date:   12/24/2021    Order Specific Question:   What is the patient's sedation requirement?    Answer:   No Sedation    Order Specific Question:   Does the patient have a pacemaker or implanted devices?    Answer:   No    Order Specific Question:   Preferred imaging location?    Answer:   Product/process development scientist (table limit-350lbs)   Meds ordered this encounter  Medications  . predniSONE (DELTASONE) 50 MG tablet    Sig: Take 1 tablet (50 mg total) by mouth daily.    Dispense:  5 tablet    Refill:  0     Discussed warning signs or symptoms. Please see discharge instructions. Patient expresses understanding.   The above documentation has been reviewed and is accurate and complete Lynne Leader, M.D.

## 2020-12-26 NOTE — Progress Notes (Signed)
Right shoulder x-ray looks normal to radiology.  MRI should be helpful

## 2020-12-26 NOTE — Progress Notes (Signed)
Cervical spine x-ray shows some arthritis changes at C6-C7.  This would potentially cause a pinched nerve at this level causing pain in your right arm.  MRI will be helpful to further evaluate this.

## 2021-01-01 ENCOUNTER — Ambulatory Visit (HOSPITAL_BASED_OUTPATIENT_CLINIC_OR_DEPARTMENT_OTHER): Payer: 59 | Admitting: Physical Therapy

## 2021-01-02 ENCOUNTER — Ambulatory Visit: Payer: 59 | Admitting: Family Medicine

## 2021-01-05 ENCOUNTER — Ambulatory Visit (HOSPITAL_BASED_OUTPATIENT_CLINIC_OR_DEPARTMENT_OTHER): Payer: 59 | Admitting: Physical Therapy

## 2021-01-05 NOTE — Addendum Note (Signed)
Addended by: Douglass Rivers T on: 01/05/2021 02:00 PM   Modules accepted: Orders

## 2021-01-07 ENCOUNTER — Encounter (HOSPITAL_BASED_OUTPATIENT_CLINIC_OR_DEPARTMENT_OTHER): Payer: 59 | Admitting: Physical Therapy

## 2021-01-10 ENCOUNTER — Other Ambulatory Visit: Payer: Self-pay

## 2021-01-10 ENCOUNTER — Ambulatory Visit (INDEPENDENT_AMBULATORY_CARE_PROVIDER_SITE_OTHER): Payer: 59

## 2021-01-10 DIAGNOSIS — M5412 Radiculopathy, cervical region: Secondary | ICD-10-CM | POA: Diagnosis not present

## 2021-01-10 DIAGNOSIS — R202 Paresthesia of skin: Secondary | ICD-10-CM

## 2021-01-12 ENCOUNTER — Telehealth: Payer: Self-pay | Admitting: Family Medicine

## 2021-01-12 DIAGNOSIS — M5412 Radiculopathy, cervical region: Secondary | ICD-10-CM

## 2021-01-12 NOTE — Telephone Encounter (Signed)
Epidural steroid injection ordered 

## 2021-01-12 NOTE — Progress Notes (Signed)
MRI cervical spine shows disc bulging causing severe narrowing of the space where the nerve needs to come out.  This would affect the right C7 nerve root.  Plan for epidural steroid injection.  Please call [place] [facility] at [phone] to schedule the epidural steroid injection.  If you would like to discuss the MRI and the epidural steroid injection in further detail please schedule a follow-up appointment with me.

## 2021-01-12 NOTE — Progress Notes (Signed)
Pt viewed Dr. Clovis Riley result note via [place].  Pt had some questions about the epidural steroid injection and called the office. I returned his call and discussed. Pt verbalized understanding.

## 2021-01-12 NOTE — Progress Notes (Signed)
Pt viewed Dr. Clovis Riley result note via [place]. Pt called and had some questions about the epidural injection. I called him back and discussed. Pt is scheduled for his epidural injection on 01/14/21. Pt verbalized understanding

## 2021-01-14 ENCOUNTER — Other Ambulatory Visit: Payer: Self-pay

## 2021-01-14 ENCOUNTER — Other Ambulatory Visit: Payer: Self-pay | Admitting: Family Medicine

## 2021-01-14 ENCOUNTER — Ambulatory Visit
Admission: RE | Admit: 2021-01-14 | Discharge: 2021-01-14 | Disposition: A | Discharge status: Home / Self Care | Payer: 59 | Source: Ambulatory Visit | Attending: Family Medicine | Admitting: Family Medicine

## 2021-01-14 DIAGNOSIS — M5412 Radiculopathy, cervical region: Secondary | ICD-10-CM

## 2021-01-14 MED ORDER — TRIAMCINOLONE ACETONIDE 40 MG/ML IJ SUSP (RADIOLOGY)
60.0000 mg | Freq: Once | INTRAMUSCULAR | Status: AC
  Administered 2021-01-14: 08:00:00 60 mg via EPIDURAL

## 2021-01-14 MED ORDER — IOPAMIDOL (ISOVUE-M 300) INJECTION 61%
11.0000 mL | Freq: Once | INTRAMUSCULAR | Status: AC | PRN
  Administered 2021-01-14: 08:00:00 11 mL via EPIDURAL

## 2021-01-14 NOTE — Discharge Instructions (Signed)

## 2021-05-21 ENCOUNTER — Other Ambulatory Visit: Payer: Self-pay | Admitting: Family Medicine

## 2021-06-02 ENCOUNTER — Ambulatory Visit
Admission: EM | Admit: 2021-06-02 | Discharge: 2021-06-02 | Disposition: A | Discharge status: Home / Self Care | Payer: 59 | Attending: Internal Medicine | Admitting: Internal Medicine

## 2021-06-02 ENCOUNTER — Other Ambulatory Visit: Payer: Self-pay

## 2021-06-02 ENCOUNTER — Encounter: Payer: Self-pay | Admitting: Emergency Medicine

## 2021-06-02 ENCOUNTER — Ambulatory Visit (INDEPENDENT_AMBULATORY_CARE_PROVIDER_SITE_OTHER): Payer: 59

## 2021-06-02 DIAGNOSIS — J208 Acute bronchitis due to other specified organisms: Secondary | ICD-10-CM | POA: Diagnosis not present

## 2021-06-02 DIAGNOSIS — R053 Chronic cough: Secondary | ICD-10-CM

## 2021-06-02 DIAGNOSIS — J029 Acute pharyngitis, unspecified: Secondary | ICD-10-CM

## 2021-06-02 DIAGNOSIS — R059 Cough, unspecified: Secondary | ICD-10-CM | POA: Diagnosis not present

## 2021-06-02 DIAGNOSIS — J069 Acute upper respiratory infection, unspecified: Secondary | ICD-10-CM | POA: Diagnosis not present

## 2021-06-02 LAB — POCT RAPID STREP A (OFFICE): Rapid Strep A Screen: NEGATIVE

## 2021-06-02 MED ORDER — AMOXICILLIN-POT CLAVULANATE 875-125 MG PO TABS: 1.0000 | ORAL_TABLET | Freq: Two times a day (BID) | ORAL | 0 refills | Status: DC

## 2021-06-02 MED ORDER — PREDNISONE 10 MG (21) PO TBPK: ORAL_TABLET | Freq: Every day | ORAL | 0 refills | Status: DC

## 2021-06-02 MED ORDER — BENZONATATE 100 MG PO CAPS: 100.0000 mg | ORAL_CAPSULE | Freq: Three times a day (TID) | ORAL | 0 refills | Status: DC | PRN

## 2021-06-02 NOTE — Discharge Instructions (Addendum)
Your chest x-ray showed possible bronchitis.  You have been treated with prednisone steroid and Augmentin antibiotic.  Cough medication has also been prescribed to take as needed.  Please obtain home blood pressure cuff and monitor at least twice daily.  Primary care assistance has been requested for you.  You will need to follow-up with them for further evaluation and management of high blood pressure.

## 2021-06-02 NOTE — ED Triage Notes (Signed)
Cough with congestion x 2 days. Facial pain, body aches, and fever

## 2021-06-02 NOTE — ED Provider Notes (Addendum)
[facility]    CSN: [id number] Arrival date & time: 06/02/21  0859      History   Chief Complaint Chief Complaint  Patient presents with   Cough    HPI Terrence Warner is a 35 y.o. male.   Patient presents with 1.5 months of nonproductive cough, nasal congestion, sore throat, body aches, fever.  Last known fever was approximately 3 weeks ago and T-max was 100.  Denies any chest pain or shortness of breath.  Denies any known sick contacts.  Denies any history of asthma or COPD.  Patient does smoke approximately 2 cigarettes/day.  Has been taking over-the-counter cough and cold medications with no improvement in symptoms.  Patient also has elevated blood pressure reading in urgent care today.  Denies chest pain, shortness of breath, headache, blurred vision, dizziness, nausea, vomiting.  Patient does not take any blood pressure medication and denies history of hypertension.   Cough  History reviewed. No pertinent past medical history.  Patient Active Problem List   Diagnosis Date Noted   Traumatic synovial effusion 04/02/2020    History reviewed. No pertinent surgical history.     Home Medications    Prior to Admission medications   Medication Sig Start Date End Date Taking? Authorizing Provider  amoxicillin-clavulanate (AUGMENTIN) 875-125 MG tablet Take 1 tablet by mouth every 12 (twelve) hours. 06/02/21  Yes , Hildred Alamin E, FNP  benzonatate (TESSALON) 100 MG capsule Take 1 capsule (100 mg total) by mouth every 8 (eight) hours as needed for cough. 06/02/21  Yes , Hildred Alamin E, FNP  predniSONE (STERAPRED UNI-PAK 21 TAB) 10 MG (21) TBPK tablet Take by mouth daily. Take 6 tabs by mouth daily  for 2 days, then 5 tabs for 2 days, then 4 tabs for 2 days, then 3 tabs for 2 days, 2 tabs for 2 days, then 1 tab by mouth daily for 2 days 06/02/21  Yes , [place] E, FNP  gabapentin (NEURONTIN) 300 MG capsule TAKE 1 CAPSULE BY MOUTH 3 TIMES DAILY AS NEEDED. 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   Review of Systems Review of Systems Per HPI  Physical Exam Triage Vital Signs ED Triage Vitals [06/02/21 1037]  Enc Vitals Group     BP (!) 194/118     Pulse Rate 72     Resp 16     Temp 98.5 F (36.9 C)     Temp Source Oral     SpO2 95 %     Weight      Height      Head Circumference      Peak Flow      Pain Score 3     Pain Loc      Pain Edu?      Excl. in [place]?    No data found.  Updated Vital Signs BP (!) 155/119 Comment: BP rechecked by the provider  Pulse 72   Temp 98.5 F (36.9 C) (Oral)   Resp 16   SpO2 95%  Visual Acuity Right Eye Distance:   Left Eye Distance:   Bilateral Distance:    Right Eye Near:   Left Eye Near:    Bilateral Near:     Physical Exam Constitutional:      General: He is not in acute distress.    Appearance: Normal appearance. He is not toxic-appearing or diaphoretic.  HENT:     Head: Normocephalic and atraumatic.     Right Ear: Tympanic membrane and ear canal normal.     Left Ear: Tympanic membrane and ear canal normal.     Nose: Congestion present.     Right Sinus: No maxillary sinus tenderness or frontal sinus tenderness.     Left Sinus: No maxillary sinus tenderness or frontal sinus tenderness.     Mouth/Throat:     Lips: Pink.     Mouth: Mucous membranes are moist.     Pharynx: Oropharyngeal exudate and posterior oropharyngeal erythema present. No pharyngeal swelling or uvula swelling.     Tonsils: No tonsillar exudate or tonsillar abscesses.  Eyes:     Extraocular Movements: Extraocular movements intact.     Conjunctiva/sclera:  Conjunctivae normal.     Pupils: Pupils are equal, round, and reactive to light.  Cardiovascular:     Rate and Rhythm: Normal rate and regular rhythm.     Pulses: Normal pulses.     Heart sounds: Normal heart sounds.  Pulmonary:     Effort: Pulmonary effort is normal. No respiratory distress.     Breath sounds: Normal breath sounds. No wheezing.  Abdominal:     General: Abdomen is flat. Bowel sounds are normal.     Palpations: Abdomen is soft.  Musculoskeletal:        General: Normal range of motion.     Cervical back: Normal range of motion.  Skin:    General: Skin is warm and dry.  Neurological:     General: No focal deficit present.     Mental Status: He is alert and oriented to person, place, and time. Mental status is at baseline.  Psychiatric:        Mood and Affect: Mood normal.        Behavior: Behavior normal.     UC Treatments / Results  Labs (all labs ordered are listed, but only abnormal results are displayed) Labs Reviewed  CULTURE, GROUP A STREP [facility])  POCT RAPID STREP A (OFFICE)    EKG   Radiology DG Chest 2 View  Result Date: 06/02/2021 CLINICAL DATA:  Cough EXAM: CHEST - 2 VIEW COMPARISON:  None. FINDINGS: Cardiac size is within normal limits. There are no signs of pulmonary edema. There is no focal pulmonary consolidation. There is mild peribronchial thickening. There is no pleural effusion or pneumothorax. IMPRESSION: Peribronchial thickening suggests bronchitis. There is no focal pulmonary consolidation. There is no pleural effusion. Electronically Signed   By: Elmer Picker M.D.   On: 06/02/2021 11:28    Procedures Procedures (including critical care time)  Medications Ordered in UC Medications - No data to display  Initial Impression / Assessment and Plan / UC Course  I have reviewed the triage vital signs and the nursing notes.  Pertinent labs & imaging results that were available during my care of the patient were reviewed by me and  considered in my medical decision making (see chart for details).     Chest x-ray showing signs of bronchitis.  Will treat with prednisone steroid taper.  Patient also has exudate to posterior  pharynx suggesting possible bacterial infection.  Rapid strep test was negative but still suspicious of strep throat.  Will treat with Augmentin antibiotic.  Benzonatate to take as needed for cough as well.  No red flags seen on exam.  Patient has elevated blood pressure reading today.  No signs of hypertensive urgency.  Patient advised to monitor blood pressure at home with home blood pressure cuff.  PCP assistance requested for patient for further evaluation and management of hypertension.  Discussed strict return precautions.  Patient verbalized understanding and was agreeable with plan. Final Clinical Impressions(s) / UC Diagnoses   Final diagnoses:  Acute upper respiratory infection  Sore throat  Persistent cough for 3 weeks or longer  Acute bronchitis due to other specified organisms     Discharge Instructions      Your chest x-ray showed possible bronchitis.  You have been treated with prednisone steroid and Augmentin antibiotic.  Cough medication has also been prescribed to take as needed.  Please obtain home blood pressure cuff and monitor at least twice daily.  Primary care assistance has been requested for you.  You will need to follow-up with them for further evaluation and management of high blood pressure.     ED Prescriptions     Medication Sig Dispense Auth. Provider   predniSONE (STERAPRED UNI-PAK 21 TAB) 10 MG (21) TBPK tablet Take by mouth daily. Take 6 tabs by mouth daily  for 2 days, then 5 tabs for 2 days, then 4 tabs for 2 days, then 3 tabs for 2 days, 2 tabs for 2 days, then 1 tab by mouth daily for 2 days 42 tablet , [place] E, [place]   benzonatate (TESSALON) 100 MG capsule Take 1 capsule (100 mg total) by mouth every 8 (eight) hours as needed for cough. 21 capsule [place],  [place] E, [place]   amoxicillin-clavulanate (AUGMENTIN) 875-125 MG tablet Take 1 tablet by mouth every 12 (twelve) hours. 14 tablet [place], Michele Rockers, [place]      PDMP not reviewed this encounter.   Teodora Medici, [place] 06/02/21 1212    Teodora Medici,  06/02/21 1213

## 2021-06-05 LAB — CULTURE, GROUP A STREP (THRC)

## 2021-06-10 ENCOUNTER — Emergency Department (HOSPITAL_BASED_OUTPATIENT_CLINIC_OR_DEPARTMENT_OTHER): Payer: 59 | Admitting: Radiology

## 2021-06-10 ENCOUNTER — Ambulatory Visit: Payer: Self-pay | Admitting: *Deleted

## 2021-06-10 ENCOUNTER — Encounter (HOSPITAL_BASED_OUTPATIENT_CLINIC_OR_DEPARTMENT_OTHER): Payer: Self-pay | Admitting: Obstetrics and Gynecology

## 2021-06-10 ENCOUNTER — Other Ambulatory Visit: Payer: Self-pay

## 2021-06-10 ENCOUNTER — Emergency Department (HOSPITAL_BASED_OUTPATIENT_CLINIC_OR_DEPARTMENT_OTHER)
Admission: EM | Admit: 2021-06-10 | Discharge: 2021-06-10 | Disposition: A | Discharge status: Home / Self Care | Payer: 59 | Attending: Emergency Medicine | Admitting: Emergency Medicine

## 2021-06-10 DIAGNOSIS — F17210 Nicotine dependence, cigarettes, uncomplicated: Secondary | ICD-10-CM | POA: Diagnosis not present

## 2021-06-10 DIAGNOSIS — Z20822 Contact with and (suspected) exposure to covid-19: Secondary | ICD-10-CM | POA: Diagnosis not present

## 2021-06-10 DIAGNOSIS — B9789 Other viral agents as the cause of diseases classified elsewhere: Secondary | ICD-10-CM

## 2021-06-10 DIAGNOSIS — J069 Acute upper respiratory infection, unspecified: Secondary | ICD-10-CM | POA: Insufficient documentation

## 2021-06-10 DIAGNOSIS — R0602 Shortness of breath: Secondary | ICD-10-CM

## 2021-06-10 DIAGNOSIS — J988 Other specified respiratory disorders: Secondary | ICD-10-CM

## 2021-06-10 HISTORY — DX: Mononeuropathy, unspecified: G58.9

## 2021-06-10 LAB — CBC
HCT: 46.6 % (ref 39.0–52.0)
Hemoglobin: 15.8 g/dL (ref 13.0–17.0)
MCH: 29.4 pg (ref 26.0–34.0)
MCHC: 33.9 g/dL (ref 30.0–36.0)
MCV: 86.6 fL (ref 80.0–100.0)
Platelets: 188 10*3/uL (ref 150–400)
RBC: 5.38 MIL/uL (ref 4.22–5.81)
RDW: 12.5 % (ref 11.5–15.5)
WBC: 6.6 10*3/uL (ref 4.0–10.5)
nRBC: 0 % (ref 0.0–0.2)

## 2021-06-10 LAB — RESP PANEL BY RT-PCR (FLU A&B, COVID) ARPGX2
Influenza A by PCR: NEGATIVE
Influenza B by PCR: NEGATIVE
SARS Coronavirus 2 by RT PCR: NEGATIVE

## 2021-06-10 LAB — BASIC METABOLIC PANEL
Anion gap: 12 (ref 5–15)
BUN: 12 mg/dL (ref 6–20)
CO2: 24 mmol/L (ref 22–32)
Calcium: 9.3 mg/dL (ref 8.9–10.3)
Chloride: 105 mmol/L (ref 98–111)
Creatinine, Ser: 0.91 mg/dL (ref 0.61–1.24)
GFR, Estimated: >60 mL/min (ref >60)
Glucose, Bld: 86 mg/dL (ref 70–99)
Potassium: 4.2 mmol/L (ref 3.5–5.1)
Sodium: 141 mmol/L (ref 135–145)

## 2021-06-10 LAB — TROPONIN I (HIGH SENSITIVITY): Troponin I (High Sensitivity): 3 ng/L (ref <18)

## 2021-06-10 NOTE — Telephone Encounter (Signed)
Pt seen in UC 06/02/21 for similar symptoms. States worsening symptoms. Reports productive cough for greenish phlegm, red flecks. Reports SOB with :Long conversations" and exertion. Reports sore throat, body aches. Also reports intermittent CP "At heart area." States duration 4-5 minutes when occurs, none presently. States UC dx Bronchitis. Neg strep. Reports did not covid or flu test at that time. States afebrile. Audible wheezing during call. Pt states he stopped taking the Augmentin due to diarrhea and stopped prednisone 2 days ago "Because I over did it."  States RX cough med, "No help at all."  No PCP. Advised ED. States will follow disposition. Care advise given, pt verbalizes understanding.

## 2021-06-10 NOTE — Telephone Encounter (Signed)
     Reason for Disposition  Difficulty breathing  Answer Assessment - Initial Assessment Questions 1. LOCATION: "Where does it hurt?"       "At heart" 2. RADIATION: "Does the pain go anywhere else?" (e.g., into neck, jaw, arms, back)     no 3. ONSET: "When did the chest pain begin?" (Minutes, hours or days)      LAst week 4. PATTERN "Does the pain come and go, or has it been constant since it started?"  "Does it get worse with exertion?"      Comes and goes 5. DURATION: "How long does it last" (e.g., seconds, minutes, hours)     4-5 minutes 6. SEVERITY: "How bad is the pain?"  (e.g., Scale 1-10; mild, moderate, or severe)    - MILD (1-3): doesn't interfere with normal activities     - MODERATE (4-7): interferes with normal activities or awakens from sleep    - SEVERE (8-10): excruciating pain, unable to do any normal activities       5/10 7. CARDIAC RISK FACTORS: "Do you have any history of heart problems or risk factors for heart disease?" (e.g., angina, prior heart attack; diabetes, high blood pressure, high cholesterol, smoker, or strong family history of heart disease)     no 8. PULMONARY RISK FACTORS: "Do you have any history of lung disease?"  (e.g., blood clots in lung, asthma, emphysema, birth control pills)     no 9. CAUSE: "What do you think is causing the chest pain?"     Unsure 10. OTHER SYMPTOMS: "Do you have any other symptoms?" (e.g., dizziness, nausea, vomiting, sweating, fever, difficulty breathing, cough)       Cough,green, red flecks.SOB with exertion, long conversation.Sore throat, body aches. Wheezing at times.  Protocols used: Chest Pain-A-AH

## 2021-06-10 NOTE — Discharge Instructions (Signed)
Use Tylenol every 4 hours and Motrin every 6 hours for body aches and fever.  Stay well-hydrated.  Have your blood pressure rechecked by primary doctor next week.  Return for worsening signs or symptoms or new concerns.  Stop taking antibiotics.

## 2021-06-10 NOTE — ED Provider Notes (Signed)
[facility] EMERGENCY DEPT Provider Note   CSN: [id number] Arrival date & time: 06/10/21  1105     History Chief Complaint  Patient presents with   Shortness of Breath    Terrence Warner is a 35 y.o. male.  Patient presents with feeling more short of breath and cough and congestion since being diagnosed with bronchitis last week.  Patient was placed on antibiotics and prednisone.  Patient excellently took double the dose of prednisone for 4 days.  Patient's had worsening signs symptoms since starting the antibiotics.  No active chest pain.  No fevers.  His children have respiratory infection currently also.      Past Medical History:  Diagnosis Date   Pinched nerve    C-7    Patient Active Problem List   Diagnosis Date Noted   Traumatic synovial effusion 04/02/2020    History reviewed. No pertinent surgical history.     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 Authorizing Provider  amoxicillin-clavulanate (AUGMENTIN) 875-125 MG tablet Take 1 tablet by mouth every 12 (twelve) hours. 06/02/21   Teodora Medici, FNP  benzonatate (TESSALON) 100 MG capsule Take 1 capsule (100 mg total) by mouth every 8 (eight) hours as needed for cough. 06/02/21   Teodora Medici, FNP  gabapentin (NEURONTIN) 300 MG capsule TAKE 1 CAPSULE BY MOUTH 3 TIMES DAILY AS NEEDED. 05/21/21   Gregor Hams, MD  predniSONE (STERAPRED UNI-PAK 21 TAB) 10 MG (21) TBPK tablet Take by mouth daily. Take 6 tabs by mouth daily  for 2 days, then 5 tabs for 2 days, then 4 tabs for 2 days, then 3 tabs for 2 days, 2 tabs for 2  days, then 1 tab by mouth daily for 2 days 06/02/21   Teodora Medici, FNP    Allergies    Patient has no known allergies.  Review of Systems   Review of Systems  Constitutional:  Positive for fatigue. Negative for chills and fever.  HENT:  Positive for congestion.   Eyes:  Negative for visual disturbance.  Respiratory:  Positive for cough and shortness of breath.   Cardiovascular:  Negative for chest pain.  Gastrointestinal:  Negative for abdominal pain and vomiting.  Genitourinary:  Negative for dysuria and flank pain.  Musculoskeletal:  Negative for back pain, neck pain and neck stiffness.  Skin:  Negative for rash.  Neurological:  Negative for light-headedness and headaches.   Physical Exam Updated Vital Signs BP (!) 173/112   Pulse 61   Temp 97.9 F (36.6 C)   Resp 12   Ht 5\' 9"  (1.753 m)   Wt 72.6 kg   SpO2 100%   BMI 23.63 kg/m   Physical Exam Vitals and nursing note reviewed.  Constitutional:      General: He is not in acute distress.    Appearance: He is well-developed.  HENT:     Head: Normocephalic and atraumatic.     Mouth/Throat:     Mouth: Mucous membranes are moist.  Eyes:  General:        Right eye: No discharge.        Left eye: No discharge.     Conjunctiva/sclera: Conjunctivae normal.  Neck:     Trachea: No tracheal deviation.  Cardiovascular:     Rate and Rhythm: Normal rate and regular rhythm.     Heart sounds: No murmur heard. Pulmonary:     Effort: Pulmonary effort is normal.     Breath sounds: Normal breath sounds.  Abdominal:     General: There is no distension.     Palpations: Abdomen is soft.     Tenderness: There is no abdominal tenderness. There is no guarding.  Musculoskeletal:     Cervical back: Normal range of motion and neck supple. No rigidity.     Right lower leg: No edema.     Left lower leg: No edema.  Skin:    General: Skin is warm.     Capillary Refill: Capillary refill takes less than 2 seconds.     Findings: No  rash.  Neurological:     General: No focal deficit present.     Mental Status: He is alert.     Cranial Nerves: No cranial nerve deficit.  Psychiatric:        Mood and Affect: Mood normal.    ED Results / Procedures / Treatments   Labs (all labs ordered are listed, but only abnormal results are displayed) Labs Reviewed  RESP PANEL BY RT-PCR (FLU A&B, COVID) [id number]  BASIC METABOLIC PANEL  CBC  TROPONIN I (HIGH SENSITIVITY)  TROPONIN I (HIGH SENSITIVITY)    EKG EKG Interpretation  Date/Time:  Wednesday June 10 2021 11:23:13 EST Ventricular Rate:  60 PR Interval:  146 QRS Duration: 90 QT Interval:  372 QTC Calculation: 372 R Axis:   40 Text Interpretation: Normal sinus rhythm Normal ECG Confirmed by Elnora Morrison [phone]) on 06/10/2021 11:36:45 AM  Radiology DG Chest 2 View  Result Date: 06/10/2021 CLINICAL DATA:  35 year old male with history of shortness of breath, cough and congestion with chest pain for 1 week. EXAM: CHEST - 2 VIEW COMPARISON:  Chest x-ray 06/02/2021. FINDINGS: Lung volumes are normal. No consolidative airspace disease. No pleural effusions. No pneumothorax. No pulmonary nodule or mass noted. Pulmonary vasculature and the cardiomediastinal silhouette are within normal limits. IMPRESSION: No radiographic evidence of acute cardiopulmonary disease. Electronically Signed   By: Vinnie Langton M.D.   On: 06/10/2021 11:49    Procedures Procedures   Medications Ordered in ED Medications - No data to display  ED Course  I have reviewed the triage vital signs and the nursing notes.  Pertinent labs & imaging results that were available during my care of the patient were reviewed by me and considered in my medical decision making (see chart for details).    MDM Rules/Calculators/A&P                           Presents with clinical concern for viral respiratory infection and likely side effects from antibiotics.  Discussed no indication for antibiotics  at this time and to not complete the course as he has had worsening symptoms with them.  Patient had blood work done due to worsening signs and symptoms and reassuring normal white count, normal hemoglobin, normal electrolytes.  Chest x-ray no acute infiltrate or cardiomegaly.  Patient stable for outpatient follow-up, normal oxygenation and no fever.  EKG reviewed no acute ischemia, troponin negative reviewed.  Final Clinical Impression(s) / ED Diagnoses Final diagnoses:  Viral respiratory infection  Shortness of breath    Rx / DC Orders ED Discharge Orders     None        Elnora Morrison, MD 06/10/21 1312

## 2021-06-10 NOTE — ED Triage Notes (Signed)
Patient reports he was diagnosed with bronchitis last week and was placed on amoxicillin and prednisone. Patient reports he has been increasingly short of breath with some irritation around the left pectoral region.

## 2022-01-12 ENCOUNTER — Ambulatory Visit (HOSPITAL_COMMUNITY)
Admission: EM | Admit: 2022-01-12 | Discharge: 2022-01-12 | Disposition: A | Discharge status: Home / Self Care | Payer: 59 | Attending: Student | Admitting: Student

## 2022-01-12 ENCOUNTER — Encounter (HOSPITAL_COMMUNITY): Payer: Self-pay | Admitting: Emergency Medicine

## 2022-01-12 ENCOUNTER — Other Ambulatory Visit: Payer: Self-pay

## 2022-01-12 DIAGNOSIS — K2900 Acute gastritis without bleeding: Secondary | ICD-10-CM | POA: Diagnosis not present

## 2022-01-12 MED ORDER — ALUM & MAG HYDROXIDE-SIMETH 200-200-20 MG/5ML PO SUSP
30.0000 mL | Freq: Once | ORAL | Status: AC
  Administered 2022-01-12: 30 mL via ORAL

## 2022-01-12 MED ORDER — ALUM & MAG HYDROXIDE-SIMETH 200-200-20 MG/5ML PO SUSP
ORAL | Status: AC
  Filled 2022-01-12: qty 30

## 2022-01-12 MED ORDER — LANSOPRAZOLE 15 MG PO CPDR: 15.0000 mg | DELAYED_RELEASE_CAPSULE | Freq: Every day | ORAL | 1 refills | Status: DC

## 2022-01-12 MED ORDER — LIDOCAINE VISCOUS HCL 2 % MT SOLN
15.0000 mL | Freq: Once | OROMUCOSAL | Status: AC
  Administered 2022-01-12: 15 mL via ORAL

## 2022-01-12 MED ORDER — ONDANSETRON 8 MG PO TBDP: 8.0000 mg | ORAL_TABLET | Freq: Three times a day (TID) | ORAL | 0 refills | Status: DC | PRN

## 2022-01-12 MED ORDER — LIDOCAINE VISCOUS HCL 2 % MT SOLN
OROMUCOSAL | Status: AC
  Filled 2022-01-12: qty 15

## 2022-01-12 NOTE — ED Provider Notes (Signed)
[facility]    CSN: [id number] Arrival date & time: 01/12/22  0836      History   Chief Complaint Chief Complaint  Patient presents with   Abdominal Pain    HPI Terrence Warner is a 36 y.o. male presenting with epigastric pain for 4 days.  Patient describes long history of similar issues, but he has never sought medical attention for it in the past.  He describes 4 days of epigastric burning with burning radiating up the esophagus.  Symptoms are worse at night.  He states the symptoms are particularly bad today, with abdominal cramping and few episodes of bilious vomiting.  He had a small normal bowel movement this morning, and is still passing gas.  He denies excessive ibuprofen, NSAIDs, EtOH, spicy food, caffeine.  Emesis is never bloody or coffee-ground's.  Denies melena, hematochezia.  States he needs a work note. No prior history abd procedures. No recent travel. No recent abx.   HPI  Past Medical History:  Diagnosis Date   Pinched nerve    C-7    Patient Active Problem List   Diagnosis Date Noted   Traumatic synovial effusion 04/02/2020    History reviewed. No pertinent surgical history.     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   Review of Systems Review of Systems  Gastrointestinal:  Positive for abdominal pain, nausea and vomiting.  All other systems reviewed and are negative.    Physical Exam Triage Vital Signs ED Triage Vitals  Enc Vitals Group     BP  01/12/22 0938 (!) 148/94     Pulse Rate 01/12/22 0938 66     Resp 01/12/22 0938 (!) 22     Temp 01/12/22 0938 (!) 97.5 F (36.4 C)     Temp Source 01/12/22 0938 Oral     SpO2 01/12/22 0938 98 %     Weight --      Height --      Head Circumference --      Peak Flow --      Pain Score 01/12/22 0935 4     Pain Loc --      Pain Edu? --      Excl. in [place]? --    No data found.  Updated Vital Signs BP (!) 148/94 (BP Location: Left Arm)   Pulse 66   Temp (!) 97.5 F (36.4 C) (Oral)   Resp (!) 22   SpO2 98%   Visual Acuity Right Eye Distance:   Left Eye Distance:   Bilateral Distance:    Right Eye Near:   Left Eye Near:    Bilateral Near:     Physical Exam Vitals reviewed.  Constitutional:      General: He is not in acute distress.    Appearance: Normal appearance. He is not ill-appearing.  HENT:  Head: Normocephalic and atraumatic.     Mouth/Throat:     Mouth: Mucous membranes are moist.     Comments: Moist mucous membranes Eyes:     Extraocular Movements: Extraocular movements intact.     Pupils: Pupils are equal, round, and reactive to light.  Cardiovascular:     Rate and Rhythm: Normal rate and regular rhythm.     Heart sounds: Normal heart sounds.  Pulmonary:     Effort: Pulmonary effort is normal.     Breath sounds: Normal breath sounds. No wheezing, rhonchi or rales.  Abdominal:     General: Bowel sounds are increased. There is no distension.     Palpations: Abdomen is soft. There is no mass.     Tenderness: There is abdominal tenderness in the epigastric area. There is no right CVA tenderness, left CVA tenderness, guarding or rebound. Negative signs include Murphy's sign, Rovsing's sign and McBurney's sign.     Comments: BS increased throughout. Epigastric pain without guarding or rebound. Comfortable throughout exam.   Skin:    General: Skin is warm.     Capillary Refill: Capillary refill takes 2 to 3 seconds.     Comments: Good skin turgor   Neurological:     General: No focal deficit present.     Mental Status: He is alert and oriented to person, place, and time.  Psychiatric:        Mood and Affect: Mood normal.        Behavior: Behavior normal.      UC Treatments / Results  Labs (all labs ordered are listed, but only abnormal results are displayed) Labs Reviewed - No data to display  EKG   Radiology No results found.  Procedures Procedures (including critical care time)  Medications Ordered in UC Medications  alum & mag hydroxide-simeth (MAALOX/MYLANTA) 200-200-20 MG/5ML suspension 30 mL (has no administration in time range)    And  lidocaine (XYLOCAINE) 2 % viscous mouth solution 15 mL (has no administration in time range)    Initial Impression / Assessment and Plan / UC Course  I have reviewed the triage vital signs and the nursing notes.  Pertinent labs & imaging results that were available during my care of the patient were reviewed by me and considered in my medical decision making (see chart for details).     This patient is a very pleasant 36 y.o. year old male presenting with gastritis. Afebrile, nontachy. Tolerating fluids PO. There is epigastric pain without guarding or rebound. Symptoms improved following GI cocktail. Discharged on daily lansoprazole, zofran ODT prn, and BRAT diet. Please f/u with GI given chronicity of symptoms, ddx includes GERD vs gastritis vs h pylori vs PUD. [facility] information provided. Work note provided. ED return precautions discussed. Patient verbalizes understanding and agreement.   Final Clinical Impressions(s) / UC Diagnoses   Final diagnoses:  Other acute gastritis without hemorrhage     Discharge Instructions      -Limit ibuprofen, spicy foods, alcohol, caffeine -Lansoprazole once daily x30 days -Call [facility] to schedule a follow-up -If symptoms get worse, including abdominal pain, vomiting blood or black, black stool, etc - head to the ED    ED  Prescriptions   None    PDMP not reviewed this encounter.   Hazel Sams, PA-C 01/12/22 1101

## 2022-01-12 NOTE — ED Triage Notes (Signed)
Patient reports stomach issues all his life.  Patient reports a burning sensation in epigastric area and associated with nausea.  Patient did have vomiting of stomach acid.  No diarrhea.  These symptoms for 4-5 days.  Patient woke about 4 am with "the worse stomach cramps of his life".  Reports minimal stool this morning.

## 2022-01-12 NOTE — Discharge Instructions (Addendum)
-  Limit ibuprofen, spicy foods, alcohol, caffeine -Lansoprazole once daily x30 days -Take the Zofran (ondansetron) up to 3 times daily for nausea and vomiting. Dissolve one pill under your tongue or between your teeth and your cheek. -Call [facility] to schedule a follow-up - information below  -If symptoms get worse, including abdominal pain, vomiting blood or black, black stool, etc - head to the ED

## 2022-01-13 ENCOUNTER — Encounter: Payer: Self-pay | Admitting: Physician Assistant

## 2022-01-13 ENCOUNTER — Telehealth (HOSPITAL_COMMUNITY): Payer: Self-pay | Admitting: Emergency Medicine

## 2022-01-13 MED ORDER — ONDANSETRON 8 MG PO TBDP: 8.0000 mg | ORAL_TABLET | Freq: Three times a day (TID) | ORAL | 0 refills | Status: DC | PRN

## 2022-01-13 MED ORDER — LANSOPRAZOLE 15 MG PO CPDR
15.0000 mg | DELAYED_RELEASE_CAPSULE | Freq: Every day | ORAL | 1 refills | Status: DC
Start: 2022-01-13 — End: 2022-01-14

## 2022-01-13 NOTE — Telephone Encounter (Signed)
Patient needed different pharmacy, pharmacy no longer accepts his insurance

## 2022-01-14 ENCOUNTER — Telehealth (HOSPITAL_COMMUNITY): Payer: Self-pay | Admitting: Emergency Medicine

## 2022-01-14 MED ORDER — LANSOPRAZOLE 15 MG PO CPDR
15.0000 mg | DELAYED_RELEASE_CAPSULE | Freq: Every day | ORAL | 1 refills | Status: DC
Start: 2022-01-14 — End: 2022-03-05

## 2022-01-14 MED ORDER — ONDANSETRON 8 MG PO TBDP: 8.0000 mg | ORAL_TABLET | Freq: Three times a day (TID) | ORAL | 0 refills | Status: DC | PRN

## 2022-01-19 ENCOUNTER — Encounter (HOSPITAL_COMMUNITY): Payer: Self-pay

## 2022-01-19 ENCOUNTER — Emergency Department (HOSPITAL_COMMUNITY): Payer: 59

## 2022-01-19 ENCOUNTER — Emergency Department (HOSPITAL_COMMUNITY)
Admission: EM | Admit: 2022-01-19 | Discharge: 2022-01-19 | Disposition: A | Discharge status: Home / Self Care | Payer: 59 | Attending: Emergency Medicine | Admitting: Emergency Medicine

## 2022-01-19 DIAGNOSIS — Z79899 Other long term (current) drug therapy: Secondary | ICD-10-CM | POA: Insufficient documentation

## 2022-01-19 DIAGNOSIS — R110 Nausea: Secondary | ICD-10-CM | POA: Insufficient documentation

## 2022-01-19 DIAGNOSIS — R1010 Upper abdominal pain, unspecified: Secondary | ICD-10-CM | POA: Insufficient documentation

## 2022-01-19 DIAGNOSIS — R030 Elevated blood-pressure reading, without diagnosis of hypertension: Secondary | ICD-10-CM

## 2022-01-19 LAB — COMPREHENSIVE METABOLIC PANEL
ALT: 34 U/L (ref 0–44)
AST: 23 U/L (ref 15–41)
Albumin: 4.9 g/dL (ref 3.5–5.0)
Alkaline Phosphatase: 66 U/L (ref 38–126)
Anion gap: 7 (ref 5–15)
BUN: 17 mg/dL (ref 6–20)
CO2: 28 mmol/L (ref 22–32)
Calcium: 9.9 mg/dL (ref 8.9–10.3)
Chloride: 106 mmol/L (ref 98–111)
Creatinine, Ser: 1.38 mg/dL — ABNORMAL HIGH (ref 0.61–1.24)
GFR, Estimated: >60 mL/min (ref >60)
Glucose, Bld: 97 mg/dL (ref 70–99)
Potassium: 4.6 mmol/L (ref 3.5–5.1)
Sodium: 141 mmol/L (ref 135–145)
Total Bilirubin: 0.5 mg/dL (ref 0.3–1.2)
Total Protein: 8.3 g/dL — ABNORMAL HIGH (ref 6.5–8.1)

## 2022-01-19 LAB — RAPID URINE DRUG SCREEN, HOSP PERFORMED
Amphetamines: NOT DETECTED
Barbiturates: NOT DETECTED
Benzodiazepines: POSITIVE — AB
Cocaine: POSITIVE — AB
Opiates: POSITIVE — AB
Tetrahydrocannabinol: POSITIVE — AB

## 2022-01-19 LAB — URINALYSIS, ROUTINE W REFLEX MICROSCOPIC
Bilirubin Urine: NEGATIVE
Glucose, UA: NEGATIVE mg/dL
Ketones, ur: 15 mg/dL — AB
Leukocytes,Ua: NEGATIVE
Nitrite: NEGATIVE
Protein, ur: NEGATIVE mg/dL
Specific Gravity, Urine: 1.02 (ref 1.005–1.030)
pH: 6 (ref 5.0–8.0)

## 2022-01-19 LAB — URINALYSIS, MICROSCOPIC (REFLEX): Squamous Epithelial / HPF: NONE SEEN (ref 0–5)

## 2022-01-19 LAB — CBC
HCT: 48.4 % (ref 39.0–52.0)
Hemoglobin: 16.1 g/dL (ref 13.0–17.0)
MCH: 30.6 pg (ref 26.0–34.0)
MCHC: 33.3 g/dL (ref 30.0–36.0)
MCV: 91.8 fL (ref 80.0–100.0)
Platelets: 187 10*3/uL (ref 150–400)
RBC: 5.27 MIL/uL (ref 4.22–5.81)
RDW: 13 % (ref 11.5–15.5)
WBC: 8.6 10*3/uL (ref 4.0–10.5)
nRBC: 0 % (ref 0.0–0.2)

## 2022-01-19 LAB — LIPASE, BLOOD: Lipase: 24 U/L (ref 11–51)

## 2022-01-19 MED ORDER — ONDANSETRON HCL 4 MG PO TABS: 4.0000 mg | ORAL_TABLET | Freq: Three times a day (TID) | ORAL | 0 refills | Status: DC | PRN

## 2022-01-19 MED ORDER — PANTOPRAZOLE SODIUM 40 MG IV SOLR
40.0000 mg | Freq: Once | INTRAVENOUS | Status: AC
Start: 2022-01-19 — End: 2022-01-19
  Administered 2022-01-19: 40 mg via INTRAVENOUS
  Filled 2022-01-19: qty 10

## 2022-01-19 MED ORDER — IOHEXOL 300 MG/ML  SOLN
100.0000 mL | Freq: Once | INTRAMUSCULAR | Status: AC | PRN
  Administered 2022-01-19: 100 mL via INTRAVENOUS

## 2022-01-19 MED ORDER — MAALOX MAX 400-400-40 MG/5ML PO SUSP: 5.0000 mL | Freq: Four times a day (QID) | ORAL | 0 refills | Status: DC | PRN

## 2022-01-19 MED ORDER — ALUM & MAG HYDROXIDE-SIMETH 200-200-20 MG/5ML PO SUSP
30.0000 mL | Freq: Once | ORAL | Status: AC
  Administered 2022-01-19: 30 mL via ORAL
  Filled 2022-01-19: qty 30

## 2022-01-19 MED ORDER — MORPHINE SULFATE (PF) 4 MG/ML IV SOLN
4.0000 mg | Freq: Once | INTRAVENOUS | Status: AC
  Administered 2022-01-19: 4 mg via INTRAVENOUS
  Filled 2022-01-19: qty 1

## 2022-01-19 MED ORDER — MAALOX MAX 400-400-40 MG/5ML PO SUSP: 5.0000 mL | Freq: Four times a day (QID) | ORAL | 0 refills | Status: AC | PRN

## 2022-01-19 MED ORDER — SODIUM CHLORIDE 0.9 % IV BOLUS
1000.0000 mL | Freq: Once | INTRAVENOUS | Status: AC
  Administered 2022-01-19: 1000 mL via INTRAVENOUS

## 2022-01-19 MED ORDER — ONDANSETRON HCL 4 MG/2ML IJ SOLN
4.0000 mg | Freq: Once | INTRAMUSCULAR | Status: AC
  Administered 2022-01-19: 4 mg via INTRAVENOUS
  Filled 2022-01-19: qty 2

## 2022-01-19 MED ORDER — LIDOCAINE VISCOUS HCL 2 % MT SOLN
15.0000 mL | Freq: Once | OROMUCOSAL | Status: AC
  Administered 2022-01-19: 15 mL via ORAL
  Filled 2022-01-19: qty 15

## 2022-01-19 MED ORDER — METOCLOPRAMIDE HCL 5 MG/ML IJ SOLN
10.0000 mg | Freq: Once | INTRAMUSCULAR | Status: AC
Start: 2022-01-19 — End: 2022-01-19
  Administered 2022-01-19: 10 mg via INTRAVENOUS
  Filled 2022-01-19: qty 2

## 2022-01-19 NOTE — ED Triage Notes (Signed)
Pt arrived via POV, c/o abd pain, and nausea x3 wks. Was seen at [facility] told possible ulcer. No improvement.

## 2022-01-19 NOTE — ED Notes (Signed)
I provided reinforced discharge education based off of discharge instructions. Pt acknowledged and understood my education. Pt had no further questions/concerns for provider/myself.  °

## 2022-01-19 NOTE — ED Provider Triage Note (Signed)
Emergency Medicine Provider Triage Evaluation Note  Terrence Warner , a 36 y.o. male  was evaluated in triage.  Pt complains of abdominal pain and nausea x 3 weeks. Was seen at [facility], they told him it was likely stomach ulcer but didn't run any tests. Reports constant upper abdominal burning and cramping, reflux with laying down, and diarrhea. Has been trying pepcid, zofran, pepto bismol, and tums without relief.   Review of Systems  Positive: Abd pain, nausea, diarrhea Negative: Fever, urinary sx  Physical Exam  BP (!) 147/100 (BP Location: Right Arm)   Pulse 60   Temp (!) 97.4 F (36.3 C) (Oral)   Resp 18   SpO2 100%  Gen:   Awake, no distress   Resp:  Normal effort  MSK:   Moves extremities without difficulty  Other:    Medical Decision Making  Medically screening exam initiated at 11:16 AM.  Appropriate orders placed.  Terrence Warner was informed that the remainder of the evaluation will be completed by another provider, this initial triage assessment does not replace that evaluation, and the importance of remaining in the ED until their evaluation is complete.     Izayiah Tibbitts T, PA-C 01/19/22 1118

## 2022-01-19 NOTE — Discharge Instructions (Addendum)
It was our pleasure to provide your ER care today - we hope that you feel better. Your labs and CT scan were normal. You may be suffering from severe stomach irritation. GI will further eval and treat. Follow bland diet. Avoid alcohol, drugs, spicey foods.   Drink plenty of fluids/stay well hydrated. Take zofran as need for nausea. Use drink as needed.  Follow up with GI doctor at scheduled apt, call tomorrow to ask for earlier appointment.  Your blood pressure is high today - follow up with primary care doctor in one week.  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   Return to ER if worse, new symptoms, fevers, persistent vomiting, new/worsening pain, or other concern.

## 2022-01-19 NOTE — ED Provider Notes (Signed)
Patient signed out to me by previous provider. Please refer to their note for full HPI.  Briefly this is a 36 year old male who present to the emergency department upper abdominal pain, nausea/vomiting.  Blood work is reassuring and normal.  CT shows no acute process.  Signout pending symptomatic treatment and reevaluation.  After medication patient feels improved.  Has been able to p.o.  He already has established outpatient follow-up with gastroenterology.  We will plan for outpatient follow-up and further evaluation/treatment by GI.  Patient at this time appears safe and stable for discharge and close outpatient follow up. Discharge plan and strict return to ED precautions discussed, patient verbalizes understanding and agreement.   Lorelle Gibbs, DO 01/19/22 2045

## 2022-01-19 NOTE — ED Provider Notes (Signed)
[facility] DEPT Provider Note   CSN: [id number] Arrival date & time: 01/19/22  1022     History  Chief Complaint  Patient presents with   Abdominal Pain    Terrence Warner is a 36 y.o. male.  Patient c/o mid to upper abd pain in the past few weeks. Symptoms acute onset, moderate, persistent, dull, non radiating, without consistent exacerbating or alleviating factors. No fever/chills. +nausea. No bilious or bloody emesis. No abd distension. Having regular bms. No back/flank pain. No hx pud, gallstones, or pancreatitis. Has tried antacids without significant relief. No chest pain or discomfort. No sob.   The history is provided by the patient and medical records.  Abdominal Pain Associated symptoms: nausea   Associated symptoms: no chest pain, no fever, no shortness of breath and no sore throat        Home Medications Prior to Admission medications   Medication Sig Start Date End Date Taking? Authorizing Provider  lansoprazole (PREVACID) 15 MG capsule Take 1 capsule (15 mg total) by mouth daily at 12 noon. 01/14/22   Lamptey, Myrene Galas, MD  ondansetron (ZOFRAN-ODT) 8 MG disintegrating tablet Take 1 tablet (8 mg total) by mouth every 8 (eight) hours as needed for nausea or vomiting. 01/14/22   Lamptey, Myrene Galas, MD      Allergies    Patient has no known allergies.    Review of Systems   Review of Systems  Constitutional:  Negative for fever.  HENT:  Negative for sore throat.   Eyes:  Negative for redness.  Respiratory:  Negative for shortness of breath.   Cardiovascular:  Negative for chest pain.  Gastrointestinal:  Positive for abdominal pain and nausea.  Genitourinary:  Negative for flank pain.  Musculoskeletal:  Negative for back pain and neck pain.  Skin:  Negative for rash.  Neurological:  Negative for headaches.  Hematological:  Does not bruise/bleed easily.  Psychiatric/Behavioral:  Negative for confusion.     Physical Exam Updated  Vital Signs BP (!) 147/100 (BP Location: Right Arm)   Pulse 60   Temp (!) 97.4 F (36.3 C) (Oral)   Resp 18   SpO2 100%  Physical Exam Vitals and nursing note reviewed.  Constitutional:      Appearance: Normal appearance. He is well-developed.  HENT:     Head: Atraumatic.     Nose: Nose normal.     Mouth/Throat:     Mouth: Mucous membranes are moist.     Pharynx: Oropharynx is clear.  Eyes:     General: No scleral icterus.    Conjunctiva/sclera: Conjunctivae normal.  Neck:     Trachea: No tracheal deviation.  Cardiovascular:     Rate and Rhythm: Normal rate and regular rhythm.     Pulses: Normal pulses.     Heart sounds: Normal heart sounds. No murmur heard.    No friction rub. No gallop.  Pulmonary:     Effort: Pulmonary effort is normal. No accessory muscle usage or respiratory distress.     Breath sounds: Normal breath sounds.  Abdominal:     General: Bowel sounds are normal. There is no distension.     Palpations: Abdomen is soft. There is no mass.     Tenderness: There is abdominal tenderness. There is no guarding or rebound.     Hernia: No hernia is present.     Comments: Mid abd and epigastric tenderness.   Genitourinary:    Comments: No cva tenderness. Musculoskeletal:  General: No swelling.     Cervical back: Normal range of motion and neck supple. No rigidity.  Skin:    General: Skin is warm and dry.     Findings: No rash.  Neurological:     Mental Status: He is alert.     Comments: Alert, speech clear.   Psychiatric:        Mood and Affect: Mood normal.     ED Results / Procedures / Treatments   Labs (all labs ordered are listed, but only abnormal results are displayed) Results for orders placed or performed during the hospital encounter of 01/19/22  Lipase, blood  Result Value Ref Range   Lipase 24 11 - 51 U/L  Comprehensive metabolic panel  Result Value Ref Range   Sodium 141 135 - 145 mmol/L   Potassium 4.6 3.5 - 5.1 mmol/L   Chloride  106 98 - 111 mmol/L   CO2 28 22 - 32 mmol/L   Glucose, Bld 97 70 - 99 mg/dL   BUN 17 6 - 20 mg/dL   Creatinine, Ser 1.38 (H) 0.61 - 1.24 mg/dL   Calcium 9.9 8.9 - 10.3 mg/dL   Total Protein 8.3 (H) 6.5 - 8.1 g/dL   Albumin 4.9 3.5 - 5.0 g/dL   AST 23 15 - 41 U/L   ALT 34 0 - 44 U/L   Alkaline Phosphatase 66 38 - 126 U/L   Total Bilirubin 0.5 0.3 - 1.2 mg/dL   GFR, Estimated >60 >60 mL/min   Anion gap 7 5 - 15  CBC  Result Value Ref Range   WBC 8.6 4.0 - 10.5 K/uL   RBC 5.27 4.22 - 5.81 MIL/uL   Hemoglobin 16.1 13.0 - 17.0 g/dL   HCT 48.4 39.0 - 52.0 %   MCV 91.8 80.0 - 100.0 fL   MCH 30.6 26.0 - 34.0 pg   MCHC 33.3 30.0 - 36.0 g/dL   RDW 13.0 11.5 - 15.5 %   Platelets 187 150 - 400 K/uL   nRBC 0.0 0.0 - 0.2 %      EKG None  Radiology CT Abdomen Pelvis W Contrast  Result Date: 01/19/2022 CLINICAL DATA:  Acute, nonlocalized abdominal pain. EXAM: CT ABDOMEN AND PELVIS WITH CONTRAST TECHNIQUE: Multidetector CT imaging of the abdomen and pelvis was performed using the standard protocol following bolus administration of intravenous contrast. RADIATION DOSE REDUCTION: This exam was performed according to the departmental dose-optimization program which includes automated exposure control, adjustment of the mA and/or kV according to patient size and/or use of iterative reconstruction technique. CONTRAST:  115m OMNIPAQUE IOHEXOL 300 MG/ML  SOLN COMPARISON:  None Available. FINDINGS: Lower chest: No acute abnormality. Hepatobiliary: No focal liver abnormality is seen. No gallstones, gallbladder wall thickening, or biliary dilatation. Pancreas: Unremarkable. No pancreatic ductal dilatation or surrounding inflammatory changes. Spleen: Normal in size without focal abnormality. Adrenals/Urinary Tract: Normal adrenal glands. No kidney mass, nephrolithiasis, or hydronephrosis. Urinary bladder is unremarkable. Stomach/Bowel: Stomach appears within normal limits. The appendix is visualized and  appears normal. No signs of bowel wall thickening, inflammation or distension. Colonic diverticulosis identified without signs of acute inflammation. Vascular/Lymphatic: Aortic atherosclerosis. No aneurysm. No signs of abdominopelvic adenopathy. Reproductive: Prostate is unremarkable. Other: No free fluid or fluid collections. Musculoskeletal: No acute or significant osseous findings. IMPRESSION: 1. No acute findings within the abdomen or pelvis. 2. Colonic diverticulosis without signs of acute inflammation. 3. Aortic Atherosclerosis (ICD10-I70.0). Electronically Signed   By: TKerby MoorsM.D.   On: 01/19/2022  13:30    Procedures Procedures    Medications Ordered in ED Medications - No data to display  ED Course/ Medical Decision Making/ A&P                           Medical Decision Making Problems Addressed: Elevated blood pressure reading: acute illness or injury Nausea: acute illness or injury with systemic symptoms Upper abdominal pain: acute illness or injury with systemic symptoms that poses a threat to life or bodily functions  Amount and/or Complexity of Data Reviewed External Data Reviewed: notes. Labs: ordered. Decision-making details documented in ED Course. Radiology: ordered and independent interpretation performed. Decision-making details documented in ED Course.  Risk Prescription drug management. Decision regarding hospitalization.   Iv ns. Continuous pulse ox and cardiac monitoring. Labs ordered/sent. Imaging ordered.   Differential dx includes pud, pancreatitis, CHS, gastritis, etc - disposition decision including potential need for admission for above considered - will get labs and imaging and revisit dispo decision.   Reviewed nursing notes and prior charts for additional history. External reports reviewed.   Cardiac monitor: sinus rhythm, rate 66.  Labs reviewed/interpreted by me -  wbc normal. Lipase normal.  CT reviewed/interpreted by me - no acute  process.  Iv ns bolus. Zofran iv. Po trial.  Given prolonged nature of symptoms, ?possible Cannabinoid Hyperemesis Syndrome, vs gastritis, vs other. Urine studies pending, and ED room pending.   1545 - pt discussed with/signed out to Dr Fulton Reek.            Final Clinical Impression(s) / ED Diagnoses Final diagnoses:  None    Rx / DC Orders ED Discharge Orders     None         Lajean Saver, MD 01/19/22 1551

## 2022-01-19 NOTE — ED Notes (Signed)
Pt able to tolerate PO fluids, expressed some nausea.

## 2022-01-28 ENCOUNTER — Encounter: Payer: Self-pay | Admitting: *Deleted

## 2022-02-03 NOTE — Progress Notes (Signed)
02/05/2022 Terrence Warner [id number] 1985-11-29  Referring provider: No ref. provider found Primary GI doctor: [name]  ASSESSMENT AND PLAN:   36 year old male with history of abdominal pain with nausea and vomiting, diarrhea with fecal incontinence with progressive symptoms. Due to multitude of symptoms discussed with the patient and we will go ahead with endoscopic evaluation as long as checking labs for celiac, H. pylori and inflammatory markers Discussed at length with patient about cannabinoid hyperemesis syndrome instructed to quit smoking marijuana, can take up to 9-12 months of cessation.  Can do lidocaine patches for the pain such as salon pas patches.   Zofran not helping, added on Carafate since patient is having burning epigastric pain this is not helping can do trial of amitriptyline Given Bentyl and FODMAP diet for possible IBS-D which is most likely if evaluation is negative Can also consider xifaxin in the future if everything is negative.   I recommend upper gastrointestinal and colorectal evaluation with an EGD and colonoscopy. Risk of bowel prep, conscious sedation, and EGD and colonoscopy were discussed. Risks include but are not limited to dehydration, pain, bleeding, cardiopulmonary process, bowel perforation, or other possible adverse outcomes.. Treatment plan was discussed with patient, and agreed upon.   Orders Placed This Encounter  Procedures   High sensitivity CRP   Sedimentation rate   IgA   Tissue transglutaminase, IgA   H. pylori antibody, IgG   Ambulatory referral to Gastroenterology     Patient Care Team: Patient, No Pcp Per as PCP - General (General Practice)  HISTORY OF PRESENT ILLNESS: 36 y.o. male with an insignificant past medical history presents for evaluation of abdominal pain.  Has been evaluated by PT for joint discomfort, cervical pain 01/12/2022 went to urgent care for gastritis in ER 01/19/2022 after abdominal pain with nausea  and vomiting Urine drug screen in the hospital showed positive for opiates cocaine and benzo's and marijuana.    CBC without leukocytosis, no anemia, normal platelets Unremarkable liver function creatinine 1.38, BUN 17, did have some hematuria and ketones in urine. CT abdomen pelvis showed normal liver no gallstones normal pancreas normal spleen normal stomach, appendix no bowel wall thickening.  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 Since the ER he has  States he has had lower AB pain for years, had AB pain when he was younger when patient's divorce was occurring. Has increased with stress but this pain is different.   Has upper epigastric burning pain x 2-3 weeks, nothing better, has tried antacids, maalox, prevacid not helping.  He has nausea worse in the AM, zofran is not helping, vomiting 1-3 times a day.  Can have random nocturnal emesis that will wake him.  Has history of GERD, zantac for years until recalled. Has been worsening last 2-3 weeks.  Has occ dysphagia, has to drink more fluids to get breads down, has been more consistent last week.  No melena or hematochezia.  With BM will have alternate with constipation/diarrhea, but states mainly diarrhea with urgency, can have up to 5-7 BM's in a day.   No blood in the stool.  Will have really bad gas, and in the last 2-3 months can have soft liquid stool come out with the gas.  He is frustrated because he is missing work.  He use to drink  ETOH heavy, then was once a night but none since going to the ER. No NSAIDS.  No weight loss Unknown family history of colon cancer or IBD, estranged from family. 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  Would want to be able miss once a week FMLA.   Wt  Readings from Last 3 Encounters:  02/05/22 174 lb (78.9 kg)  06/10/21 160 lb (72.6 kg)  12/24/20 159 lb 3.2 oz (72.2 kg)    Current Medications:        Current Outpatient Medications (Other):    alum & mag hydroxide-simeth (MAALOX MAX) 400-400-40 MG/5ML suspension, Take 5 mLs by mouth every 6 (six) hours as needed for indigestion.   bismuth subsalicylate (PEPTO BISMOL) 262 MG chewable tablet, Chew 524 mg by mouth as needed.   dicyclomine (BENTYL) 20 MG tablet, Take 1 tablet (20 mg total) by mouth 3 (three) times daily as needed for spasms.   lansoprazole (PREVACID) 15 MG capsule, Take 1 capsule (15 mg total) by mouth daily at 12 noon.   ondansetron (ZOFRAN-ODT) 8 MG disintegrating tablet, Take 1 tablet (8 mg total) by mouth every 8 (eight) hours as needed for nausea or vomiting.   sucralfate (CARAFATE) 1 g tablet, Take 1 tablet (1 g total) by mouth 4 (four) times daily -  with meals and at bedtime.   ondansetron (ZOFRAN) 4 MG tablet, Take 1 tablet (4 mg total) by mouth every 8 (eight) hours as needed for nausea or vomiting. (Patient not taking: Reported on 02/05/2022)  Medical History:  Past Medical History:  Diagnosis Date   Diverticulosis    Pinched nerve    C-7   Allergies: No Known Allergies   Surgical History:  He  has no past surgical history on file. Family History:  His family history includes Healthy in his father; Mental illness in his mother. Social History:   reports that he has quit smoking. His smoking use included cigarettes. He smoked an average of .5 packs per day. He has never used smokeless tobacco. He reports that he does not currently use alcohol. He reports current drug use. Drugs: Cocaine, Benzodiazepines, Marijuana, and Other-see comments.  REVIEW OF SYSTEMS  : All other systems reviewed and negative except where noted in the History of Present Illness.   PHYSICAL EXAM: BP (!) 131/101   Pulse 71   Ht '5\' 9"'$  (1.753 m)   Wt 174 lb (78.9 kg)   BMI 25.70  kg/m  General:   Pleasant, well developed male in no acute distress Head:   Normocephalic and atraumatic. Eyes:  sclerae anicteric,conjunctive pink  Heart:   regular rate and rhythm Pulm:  Clear anteriorly; no wheezing Abdomen:   Soft, Non-distended AB, Active bowel sounds. mild tenderness in the epigastrium and in the lower abdomen. With guarding and Without rebound, No organomegaly appreciated. Rectal: Not evaluated Extremities:  Without edema. Msk: Symmetrical without gross deformities. Peripheral pulses intact.  Neurologic:  Alert and  oriented x4;  No focal deficits.  Skin:   Dry and intact without significant lesions or rashes. Psychiatric:  Cooperative. Normal mood and affect.    Vladimir Crofts, PA-C 10:09 AM

## 2022-02-05 ENCOUNTER — Encounter: Payer: Self-pay | Admitting: Physician Assistant

## 2022-02-05 ENCOUNTER — Ambulatory Visit (INDEPENDENT_AMBULATORY_CARE_PROVIDER_SITE_OTHER): Payer: 59 | Admitting: Physician Assistant

## 2022-02-05 ENCOUNTER — Other Ambulatory Visit (INDEPENDENT_AMBULATORY_CARE_PROVIDER_SITE_OTHER): Payer: 59

## 2022-02-05 VITALS — BP 131/101 | HR 71 | Ht 69.0 in | Wt 174.0 lb

## 2022-02-05 DIAGNOSIS — R197 Diarrhea, unspecified: Secondary | ICD-10-CM

## 2022-02-05 DIAGNOSIS — R112 Nausea with vomiting, unspecified: Secondary | ICD-10-CM

## 2022-02-05 DIAGNOSIS — R1013 Epigastric pain: Secondary | ICD-10-CM

## 2022-02-05 DIAGNOSIS — R159 Full incontinence of feces: Secondary | ICD-10-CM | POA: Diagnosis not present

## 2022-02-05 LAB — HIGH SENSITIVITY CRP: CRP, High Sensitivity: 4.58 mg/L (ref 0.000–5.000)

## 2022-02-05 LAB — SEDIMENTATION RATE: Sed Rate: 6 mm/hr (ref 0–15)

## 2022-02-05 LAB — H. PYLORI ANTIBODY, IGG: H Pylori IgG: NEGATIVE

## 2022-02-05 MED ORDER — SUCRALFATE 1 G PO TABS: 1.0000 g | ORAL_TABLET | Freq: Three times a day (TID) | ORAL | 0 refills | Status: DC

## 2022-02-05 MED ORDER — SUPREP BOWEL PREP KIT 17.5-3.13-1.6 GM/177ML PO SOLN: 1.0000 | ORAL | 0 refills | Status: DC

## 2022-02-05 MED ORDER — DICYCLOMINE HCL 20 MG PO TABS: 20.0000 mg | ORAL_TABLET | Freq: Three times a day (TID) | ORAL | 1 refills | Status: DC | PRN

## 2022-02-05 NOTE — Progress Notes (Signed)
I agree with the assessment and plan as outlined by Ms. Collier. 

## 2022-02-05 NOTE — Patient Instructions (Addendum)
We have provided you with a work note.  You have been scheduled for an endoscopy and colonoscopy. Please follow the written instructions given to you at your visit today. Please pick up your prep supplies at the pharmacy within the next 1-3 days. If you use inhalers (even only as needed), please bring them with you on the day of your procedure.  Your provider has requested that you go to the basement level for lab work before leaving today. Press "B" on the elevator. The lab is located at the first door on the left as you exit the elevator.  Take hot showers if helps Can do heating pad Can try lidocaine patches on your AB Do not smoke marijuana for at least 9 months.   Can take dicyclomine AS needed or prior to work for prevention.  Continue prevacid, will add on carafate.   Please take your proton pump inhibitor medication, prevacid  Please take this medication 30 minutes to 1 hour before meals- this makes it more effective.  Avoid spicy and acidic foods Avoid fatty foods Limit your intake of coffee, tea, alcohol, and carbonated drinks Work to maintain a healthy weight Keep the head of the bed elevated at least 3 inches with blocks or a wedge pillow if you are having any nighttime symptoms Stay upright for 2 hours after eating Avoid meals and snacks three to four hours before bedtime   Please try low FODMAP diet Try trial off milk/lactose products.  Add fiber like benefiber or citracel once a day Increase activity Can do trial of IBGard for AB pain- Take 1-2 capsules once a day for maintence or twice a day during a flare Please try to decrease stress. if any worsening symptoms like blood in stool, weight loss, please call the office or go to the ER.  If diarrhea is severe - 4+ watery diarrhea episodes, take imodium to reduce fluid loss, and start on electrolyte supplemented fluids.     FODMAP stands for fermentable oligo-, di-, mono-saccharides and polyols (1). These are the  scientific terms used to classify groups of carbs that are notorious for triggering digestive symptoms like bloating, gas and stomach pain.   FODMAPs are found in a wide range of foods in varying amounts. Some foods contain just one type, while others contain several.  The main dietary sources of the four groups of FODMAPs include:  Oligosaccharides: Wheat, rye, legumes and various fruits and vegetables, such as garlic and onions.  Disaccharides: Milk, yogurt and soft cheese. Lactose is the main carb.  Monosaccharides: Various fruit including figs and mangoes, and sweeteners such as honey and agave nectar. Fructose is the main carb.  Polyols: Certain fruits and vegetables including blackberries and lychee, as well as some low-calorie sweeteners like those in sugar-free gum.   Keep a food diary. This will help you identify foods that cause symptoms. Write down: What you eat and when. What symptoms you have. When symptoms occur in relation to your meals. Avoid foods that cause symptoms. Talk with your dietitian about other ways to get the same nutrients that are in these foods. Eat your meals slowly, in a relaxed setting. Aim to eat 5-6 small meals per day. Do not skip meals. Drink enough fluids to keep your urine clear or pale yellow. If dairy products cause your symptoms to flare up, try eating less of them. You might be able to handle yogurt better than other dairy products because it contains bacteria that help with digestion.

## 2022-02-06 LAB — IGA: Immunoglobulin A: 97 mg/dL (ref 47–310)

## 2022-02-06 LAB — TISSUE TRANSGLUTAMINASE, IGA: (tTG) Ab, IgA: <1 U/mL

## 2022-02-26 ENCOUNTER — Encounter: Payer: Self-pay | Admitting: Internal Medicine

## 2022-03-05 ENCOUNTER — Encounter: Payer: Self-pay | Admitting: Internal Medicine

## 2022-03-05 ENCOUNTER — Other Ambulatory Visit (HOSPITAL_COMMUNITY): Payer: Self-pay

## 2022-03-05 ENCOUNTER — Telehealth: Payer: Self-pay | Admitting: Pharmacy Technician

## 2022-03-05 ENCOUNTER — Ambulatory Visit (AMBULATORY_SURGERY_CENTER): Payer: 59 | Admitting: Internal Medicine

## 2022-03-05 VITALS — BP 160/106 | HR 73 | Temp 98.9°F | Resp 11 | Ht 69.0 in | Wt 174.0 lb

## 2022-03-05 DIAGNOSIS — K529 Noninfective gastroenteritis and colitis, unspecified: Secondary | ICD-10-CM | POA: Diagnosis not present

## 2022-03-05 DIAGNOSIS — K200 Eosinophilic esophagitis: Secondary | ICD-10-CM

## 2022-03-05 DIAGNOSIS — D128 Benign neoplasm of rectum: Secondary | ICD-10-CM

## 2022-03-05 DIAGNOSIS — D120 Benign neoplasm of cecum: Secondary | ICD-10-CM

## 2022-03-05 DIAGNOSIS — R1084 Generalized abdominal pain: Secondary | ICD-10-CM | POA: Diagnosis not present

## 2022-03-05 DIAGNOSIS — K2970 Gastritis, unspecified, without bleeding: Secondary | ICD-10-CM

## 2022-03-05 DIAGNOSIS — K319 Disease of stomach and duodenum, unspecified: Secondary | ICD-10-CM | POA: Diagnosis not present

## 2022-03-05 DIAGNOSIS — R1013 Epigastric pain: Secondary | ICD-10-CM | POA: Diagnosis present

## 2022-03-05 DIAGNOSIS — K5140 Inflammatory polyps of colon without complications: Secondary | ICD-10-CM

## 2022-03-05 DIAGNOSIS — K621 Rectal polyp: Secondary | ICD-10-CM | POA: Diagnosis not present

## 2022-03-05 DIAGNOSIS — K209 Esophagitis, unspecified without bleeding: Secondary | ICD-10-CM | POA: Diagnosis not present

## 2022-03-05 DIAGNOSIS — D122 Benign neoplasm of ascending colon: Secondary | ICD-10-CM

## 2022-03-05 DIAGNOSIS — K449 Diaphragmatic hernia without obstruction or gangrene: Secondary | ICD-10-CM

## 2022-03-05 DIAGNOSIS — R112 Nausea with vomiting, unspecified: Secondary | ICD-10-CM | POA: Diagnosis not present

## 2022-03-05 MED ORDER — SODIUM CHLORIDE 0.9 % IV SOLN: 500.0000 mL | INTRAVENOUS | Status: DC

## 2022-03-05 MED ORDER — LANSOPRAZOLE 30 MG PO CPDR: 30.0000 mg | DELAYED_RELEASE_CAPSULE | Freq: Two times a day (BID) | ORAL | 1 refills | Status: DC

## 2022-03-05 NOTE — Op Note (Signed)
[facility] Patient Name: Terrence Warner Procedure Date: 03/05/2022 2:40 PM MRN: [id number] Endoscopist: Sonny Masters "Terrence Warner ,  Age: 36 Referring MD:  Date of Birth: 01-09-1986 Gender: Male Account #: [id number] Procedure:                Colonoscopy Indications:              Abdominal pain, Chronic diarrhea Medicines:                Monitored Anesthesia Care Procedure:                Pre-Anesthesia Assessment:                           - Prior to the procedure, a History and Physical                            was performed, and patient medications and                            allergies were reviewed. The patient's tolerance of                            previous anesthesia was also reviewed. The risks                            and benefits of the procedure and the sedation                            options and risks were discussed with the patient.                            All questions were answered, and informed consent                            was obtained. Prior Anticoagulants: The patient has                            taken no previous anticoagulant or antiplatelet                            agents. ASA Grade Assessment: II - A patient with                            mild systemic disease. After reviewing the risks                            and benefits, the patient was deemed in                            satisfactory condition to undergo the procedure.                           After obtaining informed consent, the colonoscope  was passed under direct vision. Throughout the                            procedure, the patient's blood pressure, pulse, and                            oxygen saturations were monitored continuously. The                            Olympus CF-HQ190L [id number]) Colonoscope was                            introduced through the anus and advanced to the the                            cecum, identified by appendiceal  orifice and                            ileocecal valve. The colonoscopy was performed                            without difficulty. The patient tolerated the                            procedure well. The quality of the bowel                            preparation was good. The terminal ileum, ileocecal                            valve, appendiceal orifice, and rectum were                            photographed. Scope In: 2:55:21 PM Scope Out: 3:17:23 PM Scope Withdrawal Time: 0 hours 17 minutes 34 seconds  Total Procedure Duration: 0 hours 22 minutes 2 seconds  Findings:                 Two sessile polyps were found in the ascending                            colon and cecum. The polyps were 3 to 5 mm in size.                            These polyps were removed with a cold snare.                            Resection and retrieval were complete.                           A localized and scattered area of erythematous                            mucosa was found in the sigmoid colon, in the  descending colon, in the transverse colon and in                            the ascending colon. This was biopsied with a cold                            forceps for histology.                           A 2 mm polyp was found in the ascending colon. The                            polyp was sessile. The polyp was removed with a                            cold biopsy forceps. Resection and retrieval were                            complete.                           A 3 mm polyp was found in the rectum. The polyp was                            sessile. The polyp was removed with a cold snare.                            Resection and retrieval were complete.                           Non-bleeding internal hemorrhoids were found during                            retroflexion. Complications:            No immediate complications. Estimated Blood Loss:     Estimated blood loss  was minimal. Impression:               - Two 3 to 5 mm polyps in the ascending colon and                            in the cecum, removed with a cold snare. Resected                            and retrieved.                           - Erythematous mucosa in the sigmoid colon, in the                            descending colon, in the transverse colon and in                            the ascending colon. Biopsied.                           -  One 2 mm polyp in the ascending colon, removed                            with a cold biopsy forceps. Resected and retrieved.                           - One 3 mm polyp in the rectum, removed with a cold                            snare. Resected and retrieved.                           - Non-bleeding internal hemorrhoids. Recommendation:           - Discharge patient to home (with escort).                           - Await pathology results.                           - Return to GI clinic in 6 weeks.                           - The findings and recommendations were discussed                            with the patient. [address]Terrence Warner,  03/05/2022 3:29:24 PM

## 2022-03-05 NOTE — Progress Notes (Signed)
Sedate, gd SR, tolerated procedure well, VSS, report to RN 

## 2022-03-05 NOTE — Patient Instructions (Signed)
YOU HAD AN ENDOSCOPIC PROCEDURE TODAY AT THE [facility]:   Refer to the procedure report that was given to you for any specific questions about what was found during the examination.  If the procedure report does not answer your questions, please call your gastroenterologist to clarify.  If you requested that your care partner not be given the details of your procedure findings, then the procedure report has been included in a sealed envelope for you to review at your convenience later.  YOU SHOULD EXPECT: Some feelings of bloating in the abdomen. Passage of more gas than usual.  Walking can help get rid of the air that was put into your GI tract during the procedure and reduce the bloating. If you had a lower endoscopy (such as a colonoscopy or flexible sigmoidoscopy) you may notice spotting of blood in your stool or on the toilet paper. If you underwent a bowel prep for your procedure, you may not have a normal bowel movement for a few days.  Please Note:  You might notice some irritation and congestion in your nose or some drainage.  This is from the oxygen used during your procedure.  There is no need for concern and it should clear up in a day or so.  SYMPTOMS TO REPORT IMMEDIATELY:  Following lower endoscopy (colonoscopy or flexible sigmoidoscopy):  Excessive amounts of blood in the stool  Significant tenderness or worsening of abdominal pains  Swelling of the abdomen that is new, acute  Fever of 100F or higher  Following upper endoscopy (EGD)  Vomiting of blood or coffee ground material  New chest pain or pain under the shoulder blades  Painful or persistently difficult swallowing  New shortness of breath  Fever of 100F or higher  Black, tarry-looking stools  For urgent or emergent issues, a gastroenterologist can be reached at any hour by calling [phone]. Do not use MyChart messaging for urgent concerns.    DIET:  We do recommend a small meal at first, but  then you may proceed to your regular diet.  Drink plenty of fluids but you should avoid alcoholic beverages for 24 hours.  ACTIVITY:  You should plan to take it easy for the rest of today and you should NOT DRIVE or use heavy machinery until tomorrow (because of the sedation medicines used during the test).    FOLLOW UP: Our staff will call the number listed on your records the next business day following your procedure.  We will call around 7:15- 8:00 am to check on you and address any questions or concerns that you may have regarding the information given to you following your procedure. If we do not reach you, we will leave a message.  If you develop any symptoms (ie: fever, flu-like symptoms, shortness of breath, cough etc.) before then, please call [phone].  If you test positive for Covid 19 in the 2 weeks post procedure, please call and report this information to us.    If any biopsies were taken you will be contacted by phone or by letter within the next 1-3 weeks.  Please call us at [phone] if you have not heard about the biopsies in 3 weeks.    SIGNATURES/CONFIDENTIALITY: You and/or your care partner have signed paperwork which will be entered into your electronic medical record.  These signatures attest to the fact that that the information above on your After Visit Summary has been reviewed and is understood.  Full responsibility of the confidentiality   of this discharge information lies with you and/or your care-partner.  

## 2022-03-05 NOTE — Progress Notes (Signed)
GASTROENTEROLOGY PROCEDURE H&P NOTE   Primary Care Physician: Patient, No Pcp Per    Reason for Procedure:  Abdominal pain, N&V, diarrhea, fecal incontinence  Plan:    EGD/colonoscopy  Patient is appropriate for endoscopic procedure(s) in the ambulatory ([facility]) setting.  The nature of the procedure, as well as the risks, benefits, and alternatives were carefully and thoroughly reviewed with the patient. Ample time for discussion and questions allowed. The patient understood, was satisfied, and agreed to proceed.     HPI: Terrence Warner is a 36 y.o. male who presents for EGD/colonoscopy for evaluation of abdominal pain, N&V, diarrhea, and fecal incontinence .  Patient was most recently seen in the Gastroenterology Clinic on 02/05/22.  No interval change in medical history since that appointment. Please refer to that note for full details regarding GI history and clinical presentation.   Past Medical History:  Diagnosis Date   Diverticulosis    Pinched nerve    C-7    History reviewed. No pertinent surgical history.  Prior to Admission medications   Medication Sig Start Date End Date Taking? Authorizing Provider  dicyclomine (BENTYL) 20 MG tablet Take 1 tablet (20 mg total) by mouth 3 (three) times daily as needed for spasms. 02/05/22  Yes Vladimir Crofts, PA-C  lansoprazole (PREVACID) 15 MG capsule Take 1 capsule (15 mg total) by mouth daily at 12 noon. 01/14/22  Yes Lamptey, Myrene Galas, MD  ondansetron (ZOFRAN-ODT) 8 MG disintegrating tablet Take 1 tablet (8 mg total) by mouth every 8 (eight) hours as needed for nausea or vomiting. 01/14/22  Yes Lamptey, Myrene Galas, MD  sucralfate (CARAFATE) 1 g tablet Take 1 tablet (1 g total) by mouth 4 (four) times daily -  with meals and at bedtime. 02/05/22  Yes Vicie Mutters R, PA-C  alum & mag hydroxide-simeth (MAALOX MAX) 400-400-40 MG/5ML suspension Take 5 mLs by mouth every 6 (six) hours as needed for indigestion. Patient not taking: Reported  on 03/05/2022 01/19/22   Horton, Alvin Critchley, DO  bismuth subsalicylate (PEPTO BISMOL) 262 MG chewable tablet Chew 524 mg by mouth as needed.    [provider]  ondansetron (ZOFRAN) 4 MG tablet Take 1 tablet (4 mg total) by mouth every 8 (eight) hours as needed for nausea or vomiting. Patient not taking: Reported on 02/05/2022 01/19/22   Horton, Alvin Critchley, DO    Current Outpatient Medications  Medication Sig Dispense Refill   dicyclomine (BENTYL) 20 MG tablet Take 1 tablet (20 mg total) by mouth 3 (three) times daily as needed for spasms. 90 tablet 1   lansoprazole (PREVACID) 15 MG capsule Take 1 capsule (15 mg total) by mouth daily at 12 noon. 30 capsule 1   ondansetron (ZOFRAN-ODT) 8 MG disintegrating tablet Take 1 tablet (8 mg total) by mouth every 8 (eight) hours as needed for nausea or vomiting. 20 tablet 0   sucralfate (CARAFATE) 1 g tablet Take 1 tablet (1 g total) by mouth 4 (four) times daily -  with meals and at bedtime. 30 tablet 0   alum & mag hydroxide-simeth (MAALOX MAX) 400-400-40 MG/5ML suspension Take 5 mLs by mouth every 6 (six) hours as needed for indigestion. (Patient not taking: Reported on 03/05/2022) 355 mL 0   bismuth subsalicylate (PEPTO BISMOL) 262 MG chewable tablet Chew 524 mg by mouth as needed.     ondansetron (ZOFRAN) 4 MG tablet Take 1 tablet (4 mg total) by mouth every 8 (eight) hours as needed for nausea or vomiting. (  Patient not taking: Reported on 02/05/2022) 30 tablet 0   Current Facility-Administered Medications  Medication Dose Route Frequency Provider Last Rate Last Admin   0.9 %  sodium chloride infusion  500 mL Intravenous Continuous Sharyn Creamer, MD        Allergies as of 03/05/2022   (No Known Allergies)    Family History  Problem Relation Age of Onset   Mental illness Mother    Healthy Father    Stomach cancer Neg Hx    Esophageal cancer Neg Hx    Colon cancer Neg Hx     Social History   Socioeconomic History   Marital status: Single     Spouse name: Not on file   Number of children: 1   Years of education: Not on file   Highest education level: Not on file  Occupational History   Occupation: parts  tech  Tobacco Use   Smoking status: Former    Packs/day: 0.50    Types: Cigarettes   Smokeless tobacco: Never  Vaping Use   Vaping Use: Former  Substance and Sexual Activity   Alcohol use: Not Currently   Drug use: Yes    Types: Marijuana, Other-see comments, Benzodiazepines, Cocaine    Comment: as of 01/19/22 urine drug screen/ no cocaine or benzos for " week   Sexual activity: Yes    Birth control/protection: None  Other Topics Concern   Not on file  Social History Narrative   Not on file   Social Determinants of Health   Financial Resource Strain: Not on file  Food Insecurity: Not on file  Transportation Needs: Not on file  Physical Activity: Not on file  Stress: Not on file  Social Connections: Not on file  Intimate Partner Violence: Not on file    Physical Exam: Vital signs in last 24 hours: BP (!) 141/88   Pulse (!) 57   Temp 98.9 F (37.2 C)   Ht '5\' 9"'$  (1.753 m)   Wt 174 lb (78.9 kg)   SpO2 99%   BMI 25.70 kg/m  GEN: NAD EYE: Sclerae anicteric ENT: MMM CV: Non-tachycardic Pulm: No increased WOB GI: Soft NEURO:  Alert & Oriented   [name], MD [facility]   03/05/2022 2:33 PM

## 2022-03-05 NOTE — Op Note (Signed)
[facility] Patient Name: Terrence Warner Procedure Date: 03/05/2022 2:44 PM MRN: [id number] Endoscopist: Sonny Masters "Terrence Warner ,  Age: 36 Referring MD:  Date of Birth: 04-18-1986 Gender: Male Account #: [id number] Procedure:                Upper GI endoscopy Indications:              Abdominal pain, Diarrhea, Nausea with vomiting Medicines:                Monitored Anesthesia Care Procedure:                Pre-Anesthesia Assessment:                           - Prior to the procedure, a History and Physical                            was performed, and patient medications and                            allergies were reviewed. The patient's tolerance of                            previous anesthesia was also reviewed. The risks                            and benefits of the procedure and the sedation                            options and risks were discussed with the patient.                            All questions were answered, and informed consent                            was obtained. Prior Anticoagulants: The patient has                            taken no previous anticoagulant or antiplatelet                            agents. ASA Grade Assessment: II - A patient with                            mild systemic disease. After reviewing the risks                            and benefits, the patient was deemed in                            satisfactory condition to undergo the procedure.                           After obtaining informed consent, the endoscope was  passed under direct vision. Throughout the                            procedure, the patient's blood pressure, pulse, and                            oxygen saturations were monitored continuously. The                            GIF [id number] #[id number] was introduced through the                            mouth, and advanced to the second part of duodenum.                            The upper GI  endoscopy was accomplished without                            difficulty. The patient tolerated the procedure                            well. Scope In: Scope Out: Findings:                 The examined esophagus was normal. Biopsies were                            taken with a cold forceps for histology.                           A hiatal hernia was present.                           Localized mild inflammation characterized by                            congestion (edema) and erythema was found in the                            gastric antrum. Biopsies were taken with a cold                            forceps for histology.                           The examined duodenum was normal. Biopsies were                            taken with a cold forceps for histology. Complications:            No immediate complications. Estimated Blood Loss:     Estimated blood loss was minimal. Impression:               - Normal esophagus. Biopsied.                           -  Hiatal hernia.                           - Gastritis. Biopsied.                           - Normal examined duodenum. Biopsied. Recommendation:           - Use Prevacid (lansoprazole) 30 mg PO BID for 8                            weeks.                           - Await pathology results.                           - Perform a colonoscopy today. Sonny Masters "Terrence Warner,  03/05/2022 3:25:57 PM

## 2022-03-05 NOTE — Progress Notes (Signed)
Pt's states no medical or surgical changes since previsit or office visit. 

## 2022-03-05 NOTE — Telephone Encounter (Signed)
Patient Advocate Encounter  Received notification from [place] that prior authorization for LANSOPRAZOLE '30MG'$  is required.   PA submitted on 8.4.23 Key [id number] Status is pending    Luciano Cutter, CPhT Patient Advocate Phone: [phone]

## 2022-03-05 NOTE — Progress Notes (Signed)
Called to room to assist during endoscopic procedure.  Patient ID and intended procedure confirmed with present staff. Received instructions for my participation in the procedure from the performing physician.  

## 2022-03-08 ENCOUNTER — Telehealth: Payer: Self-pay

## 2022-03-08 NOTE — Telephone Encounter (Signed)
No answer on follow up call. 

## 2022-03-10 ENCOUNTER — Other Ambulatory Visit: Payer: Self-pay

## 2022-03-10 MED ORDER — OMEPRAZOLE 40 MG PO CPDR: 40.0000 mg | DELAYED_RELEASE_CAPSULE | Freq: Two times a day (BID) | ORAL | 3 refills | Status: AC

## 2022-03-10 NOTE — Telephone Encounter (Signed)
New Rx for Omperazole 40 mg BID transmitted to [facility] [place].

## 2022-03-10 NOTE — Telephone Encounter (Signed)
Received a fax regarding Prior Authorization from [facility] for LANSOPRAZOLE '30MG'$ . Authorization has been DENIED because SEE BELOW.

## 2022-03-10 NOTE — Telephone Encounter (Signed)
Patient was prescribed Lansoprazole 30 mg BID for treatment of gastritis. He was seen by Vicie Mutters, PA for nausea, vomiting and abdominal pain. Is it okay to change him to Omeprazole or Aciphex?

## 2022-03-12 ENCOUNTER — Encounter: Payer: Self-pay | Admitting: Internal Medicine

## 2022-04-08 ENCOUNTER — Telehealth: Payer: 59 | Admitting: Physician Assistant

## 2022-04-08 DIAGNOSIS — F411 Generalized anxiety disorder: Secondary | ICD-10-CM

## 2022-04-08 MED ORDER — HYDROXYZINE PAMOATE 25 MG PO CAPS: 25.0000 mg | ORAL_CAPSULE | Freq: Three times a day (TID) | ORAL | 0 refills | Status: DC | PRN

## 2022-04-08 NOTE — Patient Instructions (Signed)
  Terrence Warner, thank you for joining Leeanne Rio, PA-C for today's virtual visit.  While this provider is not your primary care provider (PCP), if your PCP is located in our provider database this encounter information will be shared with them immediately following your visit.  Consent: (Patient) Terrence Warner provided verbal consent for this virtual visit at the beginning of the encounter.  Current Medications:  Current Outpatient Medications:    alum & mag hydroxide-simeth (MAALOX MAX) 400-400-40 MG/5ML suspension, Take 5 mLs by mouth every 6 (six) hours as needed for indigestion. (Patient not taking: Reported on 03/05/2022), Disp: 355 mL, Rfl: 0   bismuth subsalicylate (PEPTO BISMOL) 262 MG chewable tablet, Chew 524 mg by mouth as needed., Disp: , Rfl:    dicyclomine (BENTYL) 20 MG tablet, Take 1 tablet (20 mg total) by mouth 3 (three) times daily as needed for spasms., Disp: 90 tablet, Rfl: 1   omeprazole (PRILOSEC) 40 MG capsule, Take 1 capsule (40 mg total) by mouth 2 (two) times daily before a meal. Take at least 30 minutes before eating, Disp: 60 capsule, Rfl: 3   ondansetron (ZOFRAN-ODT) 8 MG disintegrating tablet, Take 1 tablet (8 mg total) by mouth every 8 (eight) hours as needed for nausea or vomiting., Disp: 20 tablet, Rfl: 0   sucralfate (CARAFATE) 1 g tablet, Take 1 tablet (1 g total) by mouth 4 (four) times daily -  with meals and at bedtime., Disp: 30 tablet, Rfl: 0   Medications ordered in this encounter:  No orders of the defined types were placed in this encounter.    *If you need refills on other medications prior to your next appointment, please contact your pharmacy*  Follow-Up: Call back or seek an in-person evaluation if the symptoms worsen or if the condition fails to improve as anticipated.  Other Instructions Please use link below to get scheduled with a primary care office. I would recommend checking with -- [facility]  Use the hydroxyzine as  directed, when needed, for more significant acute anxiety. I want you to look into counseling resources I have provided below. Consider downloading the Calm or HeadSpace app on your phone to work on deep breathing exercises and mindfulness meditation for anxiety. Limit caffeine or alcohol as these can intensify anxiety levels.   If anything is acutely worsening or you have any thoughts of harming yourself, I want you to see care at the nearest ER. DO NOT DELAY CARE.      If you have been instructed to have an in-person evaluation today at a local Urgent Care facility, please use the link below. It will take you to a list of all of our available [facility], including address, phone number and hours of operation. Please do not delay care.  [facility]  If you or a family member do not have a primary care provider, use the link below to schedule a visit and establish care. When you choose a [facility] primary care physician or advanced practice provider, you gain a long-term partner in health. Find a Primary Care Provider  Learn more about 's in-office and virtual care options: [facility]

## 2022-04-08 NOTE — Progress Notes (Signed)
Virtual Visit Consent   Terrence Warner, you are scheduled for a virtual visit with a [facility] provider today. Just as with appointments in the office, your consent must be obtained to participate. Your consent will be active for this visit and any virtual visit you may have with one of our providers in the next 365 days. If you have a MyChart account, a copy of this consent can be sent to you electronically.  As this is a virtual visit, video technology does not allow for your provider to perform a traditional examination. This may limit your provider's ability to fully assess your condition. If your provider identifies any concerns that need to be evaluated in person or the need to arrange testing (such as labs, EKG, etc.), we will make arrangements to do so. Although advances in technology are sophisticated, we cannot ensure that it will always work on either your end or our end. If the connection with a video visit is poor, the visit may have to be switched to a telephone visit. With either a video or telephone visit, we are not always able to ensure that we have a secure connection.  By engaging in this virtual visit, you consent to the provision of healthcare and authorize for your insurance to be billed (if applicable) for the services provided during this visit. Depending on your insurance coverage, you may receive a charge related to this service.  I need to obtain your verbal consent now. Are you willing to proceed with your visit today? Terrence Warner has provided verbal consent on 04/08/2022 for a virtual visit (video or telephone). Leeanne Rio, [place]  Date: 04/08/2022 9:22 AM  Virtual Visit via Video Note   I, Leeanne Rio, connected with  Terrence Warner  ([id number], 05/25/1986) on 04/08/22 at  9:15 AM EDT by a video-enabled telemedicine application and verified that I am speaking with the correct person using two identifiers.  Location: Patient: Virtual Visit Location Patient:  Home Provider: Virtual Visit Location Provider: Home Office   I discussed the limitations of evaluation and management by telemedicine and the availability of in person appointments. The patient expressed understanding and agreed to proceed.    History of Present Illness: Terrence Warner is a 36 y.o. who identifies as a male who was assigned male at birth, and is being seen today for significant anxiety. Notes over past few months having some ongoing medical issues and stressors. Notes still having anxiety after his treatment from GI for some abdominal issues. Notes especially over past 3 days, having almost constant anxiety, with panic attacks. Feels like he needs to scream at times.Some ongoing anxiety longer but no major issue before.   Has previously had fleeting thoughts of not being here but denies any plan or intent to harm himself or others. States that is something he could never do just when very stressed it has been a fleeting thought or wanting the anxiety to stop.  Is currently without PCP.   HPI: HPI  Problems:  Patient Active Problem List   Diagnosis Date Noted   Traumatic synovial effusion 04/02/2020    Allergies: No Known Allergies Medications:  Current Outpatient Medications:    alum & mag hydroxide-simeth (MAALOX MAX) 400-400-40 MG/5ML suspension, Take 5 mLs by mouth every 6 (six) hours as needed for indigestion. (Patient not taking: Reported on 03/05/2022), Disp: 355 mL, Rfl: 0   bismuth subsalicylate (PEPTO BISMOL) 262 MG chewable tablet, Chew 524 mg by mouth  as needed., Disp: , Rfl:    dicyclomine (BENTYL) 20 MG tablet, Take 1 tablet (20 mg total) by mouth 3 (three) times daily as needed for spasms., Disp: 90 tablet, Rfl: 1   hydrOXYzine (VISTARIL) 25 MG capsule, Take 1 capsule (25 mg total) by mouth every 8 (eight) hours as needed., Disp: 60 capsule, Rfl: 0   omeprazole (PRILOSEC) 40 MG capsule, Take 1 capsule (40 mg total) by mouth 2 (two) times daily before a meal. Take  at least 30 minutes before eating, Disp: 60 capsule, Rfl: 3   ondansetron (ZOFRAN-ODT) 8 MG disintegrating tablet, Take 1 tablet (8 mg total) by mouth every 8 (eight) hours as needed for nausea or vomiting., Disp: 20 tablet, Rfl: 0   sucralfate (CARAFATE) 1 g tablet, Take 1 tablet (1 g total) by mouth 4 (four) times daily -  with meals and at bedtime., Disp: 30 tablet, Rfl: 0  Observations/Objective: Patient is well-developed, well-nourished in no acute distress.  Resting comfortably at home.  Head is normocephalic, atraumatic.  No labored breathing. Speech is clear and coherent with logical content.  Patient is alert and oriented at baseline.   Assessment and Plan: 1. Anxiety state - hydrOXYzine (VISTARIL) 25 MG capsule; Take 1 capsule (25 mg total) by mouth every 8 (eight) hours as needed.  Dispense: 60 capsule; Refill: 0  Seems like a mild generalized anxiety thtr has been more chronic but completely manageable, now with intense anxiety due to significant stressors over the past few months, culminating in an acute stress reaction with some depressed mood. No active SI/HI. Is without PCP or counseling. Reviewed deep breathing and mindfulness training. Resources given. Also given resources for local counseling, emergency numbers, etc. Sent link to help him get set up with a primary care provider for ongoing eval, management, referrals, etc. Trial of hydroxyzine for acute anxiety started. Strict ER precautions provided. Work note provided.   Follow Up Instructions: I discussed the assessment and treatment plan with the patient. The patient was provided an opportunity to ask questions and all were answered. The patient agreed with the plan and demonstrated an understanding of the instructions.  A copy of instructions were sent to the patient via MyChart unless otherwise noted below.   The patient was advised to call back or seek an in-person evaluation if the symptoms worsen or if the condition  fails to improve as anticipated.  Time:  I spent 8 minutes with the patient via telehealth technology discussing the above problems/concerns.    Leeanne Rio, PA-C

## 2022-07-28 ENCOUNTER — Encounter: Payer: Self-pay | Admitting: Internal Medicine

## 2022-08-05 ENCOUNTER — Ambulatory Visit (INDEPENDENT_AMBULATORY_CARE_PROVIDER_SITE_OTHER): Payer: 59 | Admitting: Physician Assistant

## 2022-08-05 ENCOUNTER — Encounter (HOSPITAL_COMMUNITY): Payer: Self-pay | Admitting: Physician Assistant

## 2022-08-05 ENCOUNTER — Ambulatory Visit (HOSPITAL_COMMUNITY)
Admission: EM | Admit: 2022-08-05 | Discharge: 2022-08-05 | Disposition: A | Discharge status: Home / Self Care | Payer: 59

## 2022-08-05 VITALS — BP 150/102 | HR 57 | Ht 69.0 in | Wt 177.0 lb

## 2022-08-05 DIAGNOSIS — F331 Major depressive disorder, recurrent, moderate: Secondary | ICD-10-CM | POA: Insufficient documentation

## 2022-08-05 DIAGNOSIS — F4310 Post-traumatic stress disorder, unspecified: Secondary | ICD-10-CM

## 2022-08-05 DIAGNOSIS — F411 Generalized anxiety disorder: Secondary | ICD-10-CM | POA: Diagnosis not present

## 2022-08-05 MED ORDER — HYDROXYZINE HCL 10 MG PO TABS: 10.0000 mg | ORAL_TABLET | Freq: Three times a day (TID) | ORAL | 1 refills | Status: DC | PRN

## 2022-08-05 MED ORDER — FLUOXETINE HCL 10 MG PO CAPS: 10.0000 mg | ORAL_CAPSULE | Freq: Every day | ORAL | 0 refills | Status: DC

## 2022-08-05 MED ORDER — GABAPENTIN 300 MG PO CAPS: 300.0000 mg | ORAL_CAPSULE | Freq: Three times a day (TID) | ORAL | 1 refills | Status: DC

## 2022-08-05 MED ORDER — GABAPENTIN 300 MG PO CAPS: 600.0000 mg | ORAL_CAPSULE | Freq: Every day | ORAL | 1 refills | Status: DC

## 2022-08-05 MED ORDER — FLUOXETINE HCL 20 MG PO CAPS: 20.0000 mg | ORAL_CAPSULE | Freq: Every day | ORAL | 1 refills | Status: DC

## 2022-08-05 MED ORDER — ESCITALOPRAM OXALATE 5 MG PO TABS: 5.0000 mg | ORAL_TABLET | Freq: Every day | ORAL | 0 refills | Status: DC
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
## 2022-08-05 NOTE — ED Triage Notes (Signed)
Pt presents to [facility] voluntarily accompanied by his spouse due to increased anxiety. Pt reports anxiety attacks daily for the past 2 months. Pt states he is seen by a psychiatrist at an outpatient facility in [place], and is prescribed medication for his symptoms but states it is not working properly. Pt reports passive SI this past week but denies any plan or intent. Pt denies SI/HI and AVH at this time.

## 2022-08-06 ENCOUNTER — Other Ambulatory Visit (HOSPITAL_COMMUNITY): Payer: Self-pay | Admitting: Physician Assistant

## 2022-08-06 DIAGNOSIS — F411 Generalized anxiety disorder: Secondary | ICD-10-CM

## 2022-08-06 DIAGNOSIS — F331 Major depressive disorder, recurrent, moderate: Secondary | ICD-10-CM

## 2022-08-07 ENCOUNTER — Other Ambulatory Visit (HOSPITAL_COMMUNITY): Payer: Self-pay | Admitting: Physician Assistant

## 2022-08-07 DIAGNOSIS — F331 Major depressive disorder, recurrent, moderate: Secondary | ICD-10-CM

## 2022-08-07 DIAGNOSIS — F411 Generalized anxiety disorder: Secondary | ICD-10-CM

## 2022-09-16 ENCOUNTER — Encounter (HOSPITAL_COMMUNITY): Payer: 59 | Admitting: Physician Assistant

## 2023-06-22 IMAGING — DX DG CHEST 2V
2 series · 2 of 2 positions shown · non-contrast
Comparison: None.

CLINICAL DATA: Cough

EXAM:
CHEST - 2 VIEW

[chest pa]
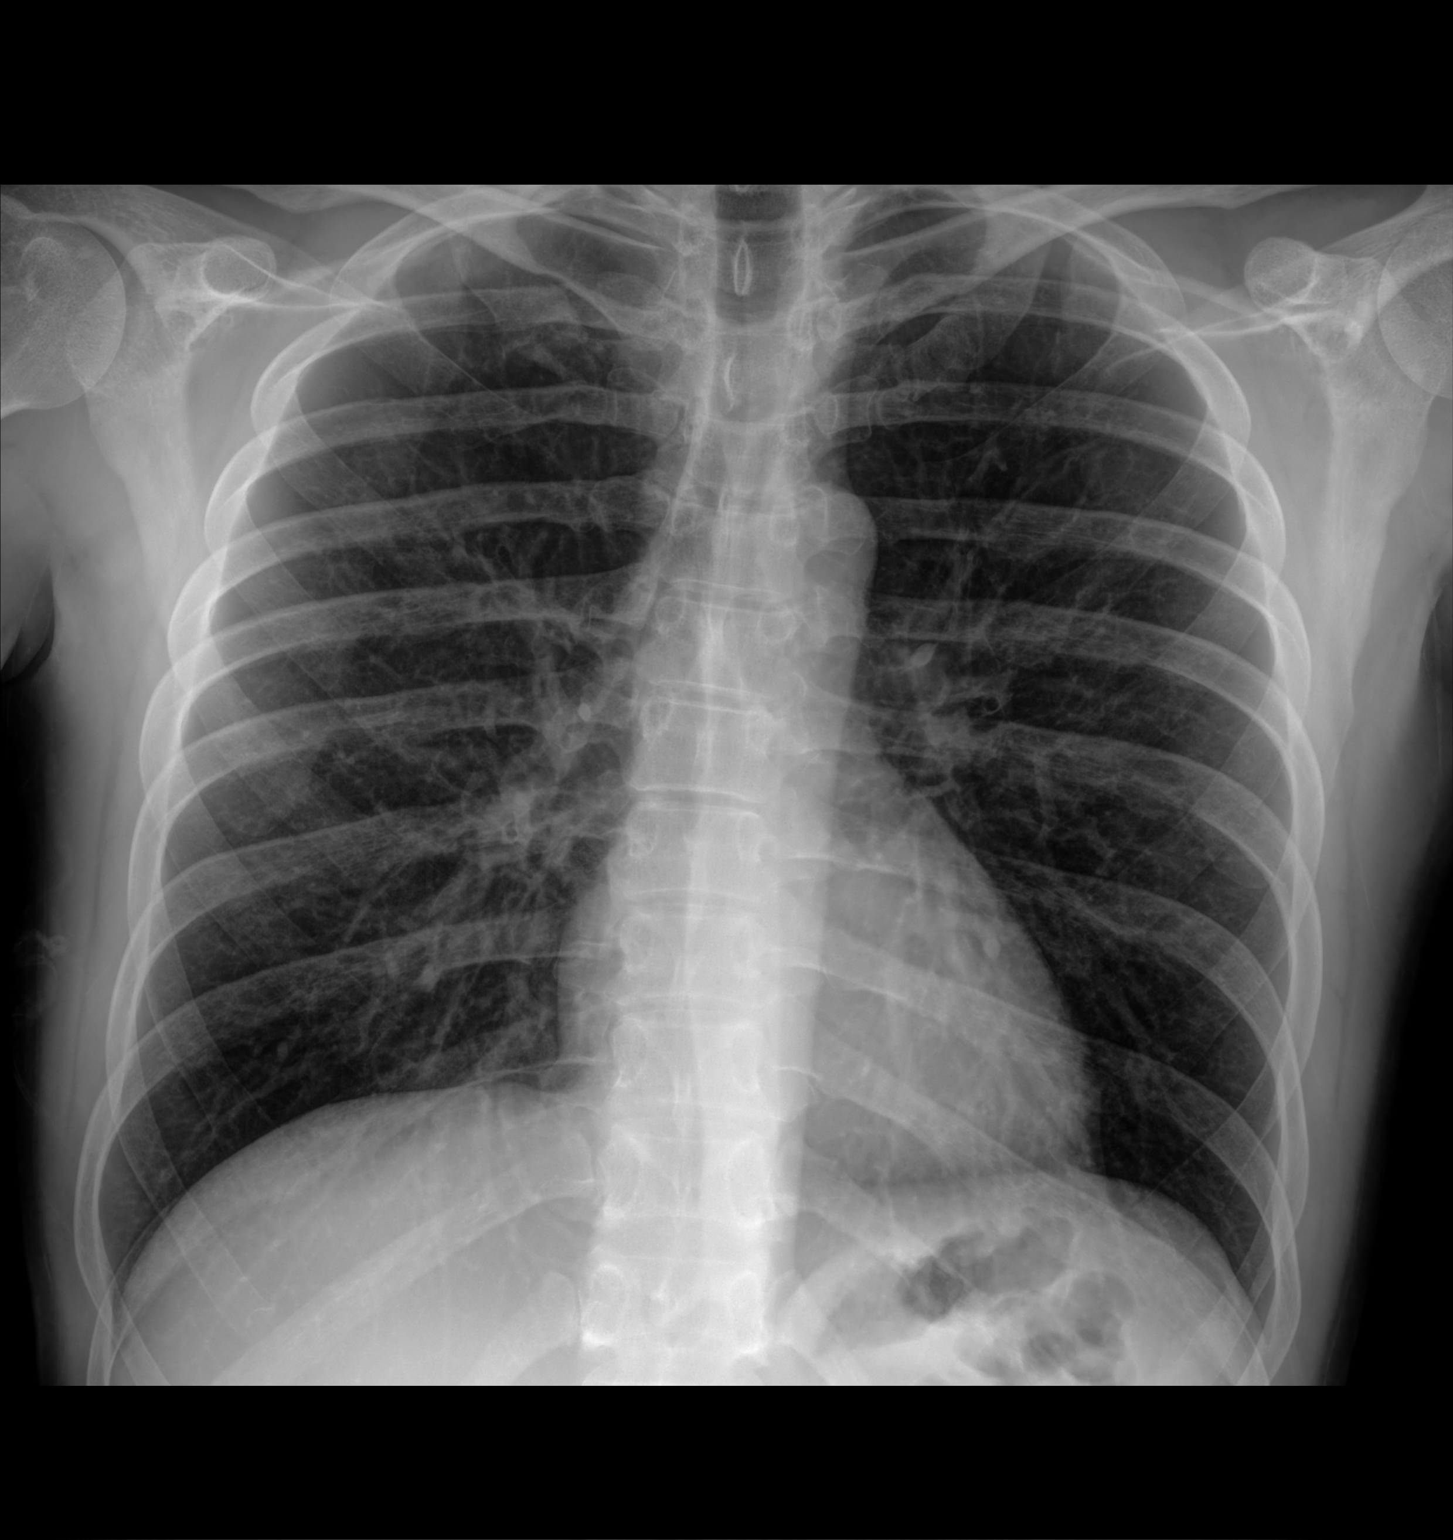

[chest lat]
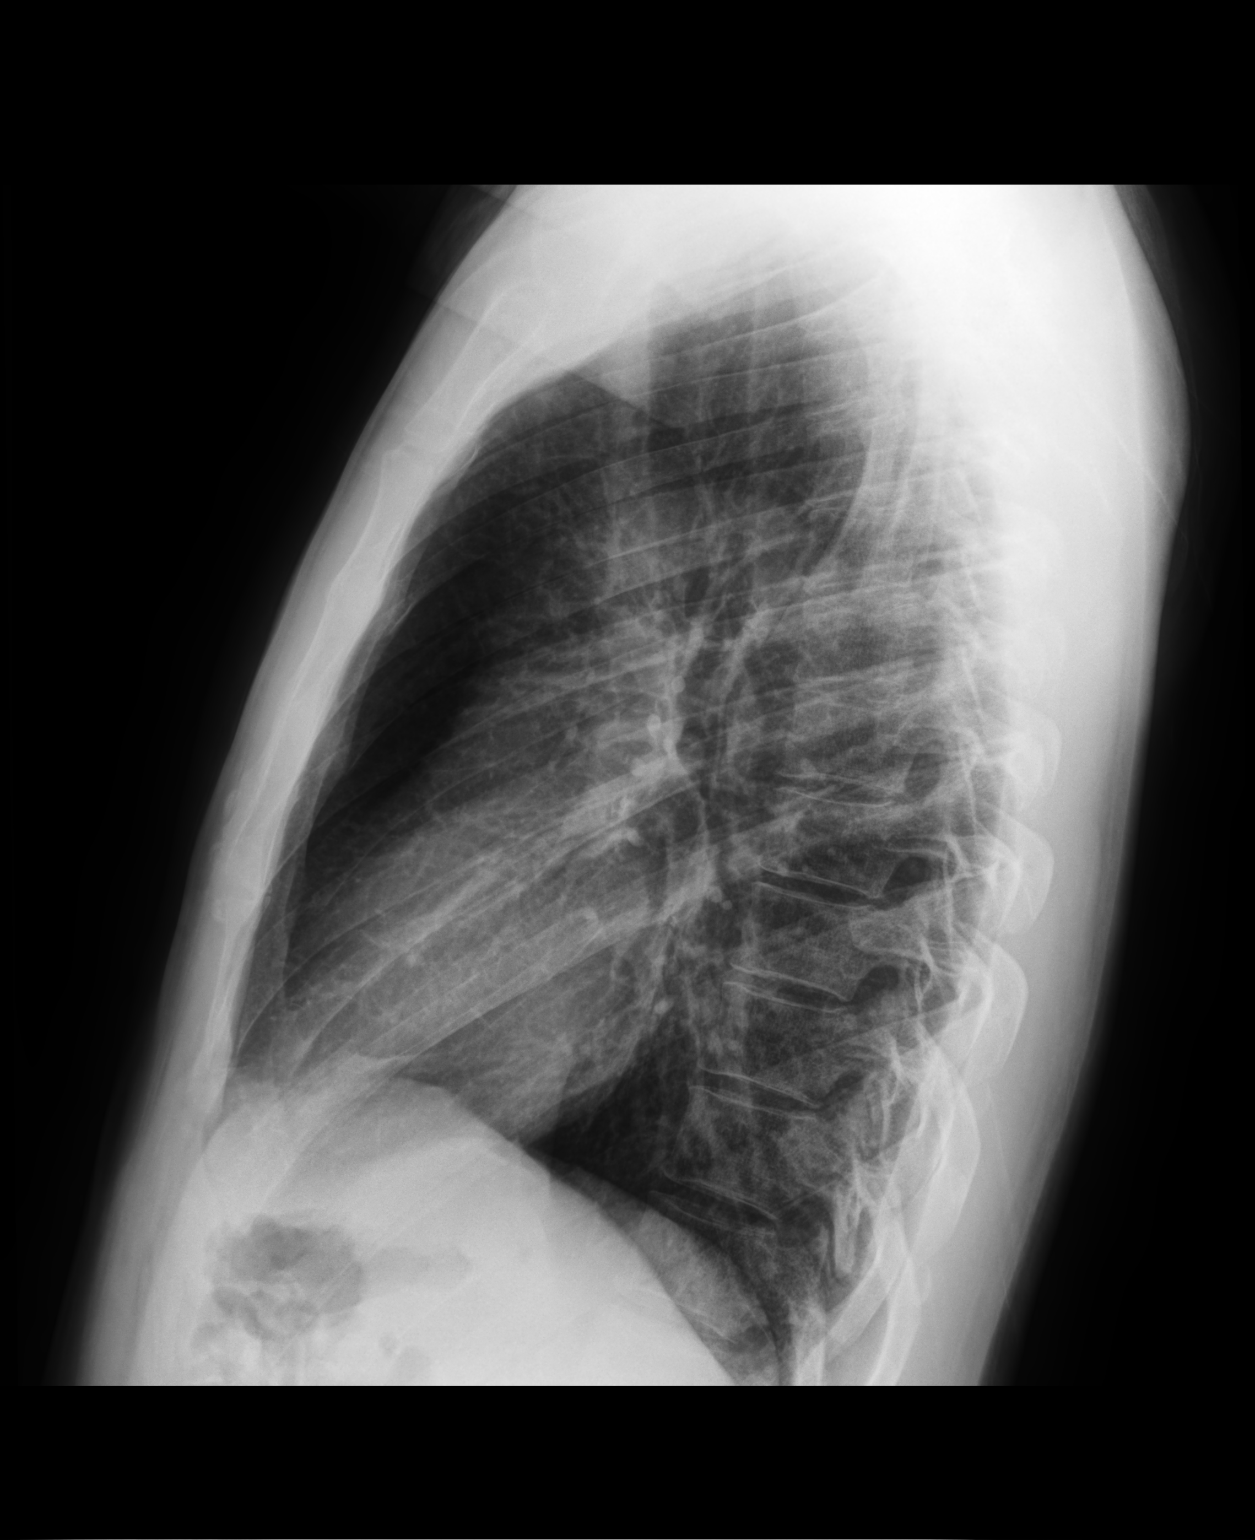

[2 of 2 positions shown; findings below may reference images not displayed]

FINDINGS: Cardiac size is within normal limits. There are no signs of
pulmonary edema. There is no focal pulmonary consolidation. There is
mild peribronchial thickening. There is no pleural effusion or
pneumothorax.
IMPRESSION: Peribronchial thickening suggests bronchitis. There is no focal
pulmonary consolidation. There is no pleural effusion.

## 2023-11-28 ENCOUNTER — Ambulatory Visit (HOSPITAL_COMMUNITY)
Admission: EM | Admit: 2023-11-28 | Discharge: 2023-11-28 | Discharge status: Against Medical Advice | Attending: Psychiatry | Admitting: Psychiatry

## 2023-11-28 DIAGNOSIS — Z91148 Patient's other noncompliance with medication regimen for other reason: Secondary | ICD-10-CM | POA: Insufficient documentation

## 2023-11-28 DIAGNOSIS — F339 Major depressive disorder, recurrent, unspecified: Secondary | ICD-10-CM | POA: Diagnosis not present

## 2023-11-28 DIAGNOSIS — F1210 Cannabis abuse, uncomplicated: Secondary | ICD-10-CM | POA: Diagnosis not present

## 2023-11-28 DIAGNOSIS — F419 Anxiety disorder, unspecified: Secondary | ICD-10-CM | POA: Insufficient documentation

## 2023-11-28 DIAGNOSIS — F410 Panic disorder [episodic paroxysmal anxiety] without agoraphobia: Secondary | ICD-10-CM | POA: Diagnosis not present

## 2023-11-28 LAB — COMPREHENSIVE METABOLIC PANEL WITH GFR
ALT: 22 U/L (ref 0–44)
AST: 17 U/L (ref 15–41)
Albumin: 4.9 g/dL (ref 3.5–5.0)
Alkaline Phosphatase: 60 U/L (ref 38–126)
Anion gap: 14 (ref 5–15)
BUN: 14 mg/dL (ref 6–20)
CO2: 25 mmol/L (ref 22–32)
Calcium: 9.8 mg/dL (ref 8.9–10.3)
Chloride: 103 mmol/L (ref 98–111)
Creatinine, Ser: 1.31 mg/dL — ABNORMAL HIGH (ref 0.61–1.24)
GFR, Estimated: >60 mL/min (ref >60)
Glucose, Bld: 88 mg/dL (ref 70–99)
Potassium: 4.3 mmol/L (ref 3.5–5.1)
Sodium: 142 mmol/L (ref 135–145)
Total Bilirubin: 0.7 mg/dL (ref 0.0–1.2)
Total Protein: 7.6 g/dL (ref 6.5–8.1)

## 2023-11-28 LAB — CBC WITH DIFFERENTIAL/PLATELET
Abs Immature Granulocytes: 0.03 10*3/uL (ref 0.00–0.07)
Basophils Absolute: 0.1 10*3/uL (ref 0.0–0.1)
Basophils Relative: 1 %
Eosinophils Absolute: 0.1 10*3/uL (ref 0.0–0.5)
Eosinophils Relative: 1 %
HCT: 50.2 % (ref 39.0–52.0)
Hemoglobin: 16.7 g/dL (ref 13.0–17.0)
Immature Granulocytes: 0 %
Lymphocytes Relative: 27 %
Lymphs Abs: 2.9 10*3/uL (ref 0.7–4.0)
MCH: 29.6 pg (ref 26.0–34.0)
MCHC: 33.3 g/dL (ref 30.0–36.0)
MCV: 88.8 fL (ref 80.0–100.0)
Monocytes Absolute: 0.8 10*3/uL (ref 0.1–1.0)
Monocytes Relative: 7 %
Neutro Abs: 6.9 10*3/uL (ref 1.7–7.7)
Neutrophils Relative %: 64 %
Platelets: 207 10*3/uL (ref 150–400)
RBC: 5.65 MIL/uL (ref 4.22–5.81)
RDW: 12.4 % (ref 11.5–15.5)
WBC: 10.7 10*3/uL — ABNORMAL HIGH (ref 4.0–10.5)
nRBC: 0 % (ref 0.0–0.2)

## 2023-11-28 LAB — POCT URINE DRUG SCREEN - MANUAL ENTRY (I-SCREEN)
POC Amphetamine UR: NOT DETECTED
POC Buprenorphine (BUP): NOT DETECTED
POC Cocaine UR: NOT DETECTED
POC Marijuana UR: POSITIVE — AB
POC Methadone UR: NOT DETECTED
POC Methamphetamine UR: NOT DETECTED
POC Morphine: NOT DETECTED
POC Oxazepam (BZO): POSITIVE — AB
POC Oxycodone UR: NOT DETECTED
POC Secobarbital (BAR): NOT DETECTED

## 2023-11-28 LAB — TSH: TSH: 2.815 u[IU]/mL (ref 0.350–4.500)

## 2023-11-28 LAB — ETHANOL: Alcohol, Ethyl (B): <15 mg/dL (ref <15)

## 2023-11-28 MED ORDER — OLANZAPINE 5 MG PO TBDP
5.0000 mg | ORAL_TABLET | Freq: Three times a day (TID) | ORAL | Status: DC | PRN
  Administered 2023-11-28: 5 mg via ORAL
  Filled 2023-11-28: qty 1

## 2023-11-28 MED ORDER — MAGNESIUM HYDROXIDE 400 MG/5ML PO SUSP: 30.0000 mL | Freq: Every day | ORAL | Status: DC | PRN

## 2023-11-28 MED ORDER — OLANZAPINE 10 MG IM SOLR: 5.0000 mg | Freq: Three times a day (TID) | INTRAMUSCULAR | Status: DC | PRN

## 2023-11-28 MED ORDER — ACETAMINOPHEN 325 MG PO TABS: 650.0000 mg | ORAL_TABLET | Freq: Four times a day (QID) | ORAL | Status: DC | PRN

## 2023-11-28 MED ORDER — TRAZODONE HCL 50 MG PO TABS
50.0000 mg | ORAL_TABLET | Freq: Every evening | ORAL | Status: DC | PRN
  Administered 2023-11-28: 50 mg via ORAL
  Filled 2023-11-28 (×2): qty 1

## 2023-11-28 MED ORDER — HYDROXYZINE HCL 25 MG PO TABS
25.0000 mg | ORAL_TABLET | ORAL | Status: DC | PRN
  Administered 2023-11-28 (×2): 25 mg via ORAL
  Filled 2023-11-28 (×2): qty 1

## 2023-11-28 MED ORDER — ALUM & MAG HYDROXIDE-SIMETH 200-200-20 MG/5ML PO SUSP: 30.0000 mL | ORAL | Status: DC | PRN

## 2023-11-28 MED ORDER — OLANZAPINE 10 MG IM SOLR: 10.0000 mg | Freq: Three times a day (TID) | INTRAMUSCULAR | Status: DC | PRN

## 2023-11-28 NOTE — ED Notes (Addendum)
 Patient complaining increased anxiety, panic attacks feeling like he can't breath. Patient say "here is doing no good anymore. I want to go home. At least home I am with my family and its a familiar place." NP notified

## 2023-11-28 NOTE — ED Provider Notes (Signed)
 [facility] Continuous Assessment Admission H&P  Date: 11/29/23 Patient Name: Terrence Warner MRN: [id number] Chief Complaint: increase depression and anxiety  Diagnoses:  Final diagnoses:  Recurrent major depressive disorder, remission status unspecified (HCC)  Panic attack  H/O medication noncompliance  Marijuana abuse    HPI: Terrence Warner, 38 y/o male with a history of  MDD, GAD and cocaine abuse,  presented to [facility] voluntarily accompanied by his girlfriend.  Per the patient he has been having increased anxiety with depression.  Review of patient records show the patient does have a history of cocaine abuse, benzodiazepine abuse.  According to him he was seeing a psychiatrist but because of his insurance he is not able to see any more psychiatrist.  Patient stated that he was going to the mood treatment center but his insurance ran out a year ago.  Patient also reports he is currently unemployed, lives with his girlfriend.  According to patient he last took medication last year which was Lexapro , trazodone . Patient is currently not seeing a psychiatrist or therapist.  Face-to-face evaluation of patient, patient is alert and oriented x 4, speech is clear, maintain eye contact.  Patient denies SI, HI, AVH or paranoia.  Patient denies access to guns denied wanting to hurt himself or others.  Denies alcohol use.  Reports he smoked marijuana on a daily basis.  Also report that he last took some benzodiazepine yesterday which according to him, he got from his friend.   When asked if he is prescribed medicine patient stated no he got it from someone.  At this present moment patient does not seem to be influenced by internal stimuli.  However given patient current state and mood stating that he is anxious and having increased depression.  Writer discussed with patient the need for admissions.  Patient is in agreement.  Recommend observation.  Total Time spent with patient: 20  minutes  Musculoskeletal  Strength & Muscle Tone: within normal limits Gait & Station: normal Patient leans: N/A  Psychiatric Specialty Exam  Presentation General Appearance:  Casual  Eye Contact: Fair  Speech: Clear and Coherent  Speech Volume: Normal  Handedness: Right   Mood and Affect  Mood: Anxious; Depressed  Affect: Congruent   Thought Process  Thought Processes: Coherent  Descriptions of Associations:Intact  Orientation:Full (Time, Place and Person)  Thought Content:Logical    Hallucinations:Hallucinations: None  Ideas of Reference:None  Suicidal Thoughts:Suicidal Thoughts: No  Homicidal Thoughts:Homicidal Thoughts: No   Sensorium  Memory: Immediate Fair  Judgment: Fair  Insight: Fair   Art therapist  Concentration: Fair  Attention Span: Fair  Recall: Good  Fund of Knowledge: Good  Language: Good   Psychomotor Activity  Psychomotor Activity: Psychomotor Activity: Normal   Assets  Assets: Desire for Improvement   Sleep  Sleep: Sleep: Fair Number of Hours of Sleep: 6   Nutritional Assessment (For OBS and FBC admissions only) Has the patient had a weight loss or gain of 10 pounds or more in the last 3 months?: No Has the patient had a decrease in food intake/or appetite?: No Does the patient have dental problems?: No Does the patient have eating habits or behaviors that may be indicators of an eating disorder including binging or inducing vomiting?: No Has the patient recently lost weight without trying?: 0 Has the patient been eating poorly because of a decreased appetite?: 0 Malnutrition Screening Tool Score: 0    Physical Exam HENT:     Head: Normocephalic.  Nose: Nose normal.  Eyes:     Pupils: Pupils are equal, round, and reactive to light.  Cardiovascular:     Rate and Rhythm: Normal rate.  Pulmonary:     Effort: Pulmonary effort is normal.  Musculoskeletal:        General: Normal  range of motion.     Cervical back: Normal range of motion.  Neurological:     General: No focal deficit present.     Mental Status: He is alert.  Psychiatric:        Mood and Affect: Mood normal.        Behavior: Behavior normal.        Thought Content: Thought content normal.        Judgment: Judgment normal.    Review of Systems  Constitutional: Negative.   HENT: Negative.    Eyes: Negative.   Respiratory: Negative.    Cardiovascular: Negative.   Gastrointestinal: Negative.   Genitourinary: Negative.   Musculoskeletal: Negative.   Skin: Negative.   Neurological: Negative.   Psychiatric/Behavioral:  Positive for depression and substance abuse. The patient is nervous/anxious.     Blood pressure (!) 138/100, pulse 100, temperature 98.7 F (37.1 C), temperature source Oral, resp. rate 18, SpO2 99%. There is no height or weight on file to calculate BMI.  Past Psychiatric History: MDD, GAD, panic attacks, cocaine abuse, marijuana use.  Is the patient at risk to self? No  Has the patient been a risk to self in the past 6 months? No .    Has the patient been a risk to self within the distant past? No   Is the patient a risk to others? No   Has the patient been a risk to others in the past 6 months? No   Has the patient been a risk to others within the distant past? No   Past Medical History: See chart  Family History: Unknown  Social History: marijuana, cocaine,  Last Labs:  Admission on 11/28/2023, Discharged on 11/28/2023  Component Date Value Ref Range Status   WBC 11/28/2023 10.7 (H)  4.0 - 10.5 K/uL Final   RBC 11/28/2023 5.65  4.22 - 5.81 MIL/uL Final   Hemoglobin 11/28/2023 16.7  13.0 - 17.0 g/dL Final   HCT 04/54/0981 50.2  39.0 - 52.0 % Final   MCV 11/28/2023 88.8  80.0 - 100.0 fL Final   MCH 11/28/2023 29.6  26.0 - 34.0 pg Final   MCHC 11/28/2023 33.3  30.0 - 36.0 g/dL Final   RDW 19/14/7829 12.4  11.5 - 15.5 % Final   Platelets 11/28/2023 207  150 - 400  K/uL Final   nRBC 11/28/2023 0.0  0.0 - 0.2 % Final   Neutrophils Relative % 11/28/2023 64  % Final   Neutro Abs 11/28/2023 6.9  1.7 - 7.7 K/uL Final   Lymphocytes Relative 11/28/2023 27  % Final   Lymphs Abs 11/28/2023 2.9  0.7 - 4.0 K/uL Final   Monocytes Relative 11/28/2023 7  % Final   Monocytes Absolute 11/28/2023 0.8  0.1 - 1.0 K/uL Final   Eosinophils Relative 11/28/2023 1  % Final   Eosinophils Absolute 11/28/2023 0.1  0.0 - 0.5 K/uL Final   Basophils Relative 11/28/2023 1  % Final   Basophils Absolute 11/28/2023 0.1  0.0 - 0.1 K/uL Final   Immature Granulocytes 11/28/2023 0  % Final   Abs Immature Granulocytes 11/28/2023 0.03  0.00 - 0.07 K/uL Final   Performed at [facility], [address] [place], [place] [address]   Sodium 11/28/2023 142  135 - 145 mmol/L Final   Potassium 11/28/2023 4.3  3.5 - 5.1 mmol/L Final   Chloride 11/28/2023 103  98 - 111 mmol/L Final   CO2 11/28/2023 25  22 - 32 mmol/L Final   Glucose, Bld 11/28/2023 88  70 - 99 mg/dL Final   Glucose reference range applies only to samples taken after fasting for at least 8 hours.   BUN 11/28/2023 14  6 - 20 mg/dL Final   Creatinine, Ser 11/28/2023 1.31 (H)  0.61 - 1.24 mg/dL Final   Calcium 60/45/4098 9.8  8.9 - 10.3 mg/dL Final   Total Protein 11/91/4782 7.6  6.5 - 8.1 g/dL Final   Albumin 95/62/1308 4.9  3.5 - 5.0 g/dL Final   AST 65/78/4696 17  15 - 41 U/L Final   ALT 11/28/2023 22  0 - 44 U/L Final   Alkaline Phosphatase 11/28/2023 60  38 - 126 U/L Final   Total Bilirubin 11/28/2023 0.7  0.0 - 1.2 mg/dL Final   GFR, Estimated 11/28/2023 >60  >60 mL/min Final   Comment: (NOTE) Calculated using the CKD-EPI Creatinine Equation (2021)    Anion gap 11/28/2023 14  5 - 15 Final   Performed at [facility], [address], [place], [place] [address]   Alcohol, Ethyl (B) 11/28/2023 <15  <15 mg/dL Final   Comment: Please note change in reference range. (NOTE) For medical purposes only. Performed  at [facility], [address] [place], [place] [address]    TSH 11/28/2023 2.815  0.350 - 4.500 uIU/mL Final   Comment: Performed by a 3rd Generation assay with a functional sensitivity of <=0.01 uIU/mL. Performed at [facility], [address], [place], [place] [address]    POC Amphetamine UR 11/28/2023 None Detected  NONE DETECTED (Cut Off Level 1000 ng/mL) Final   POC Secobarbital (BAR) 11/28/2023 None Detected  NONE DETECTED (Cut Off Level 300 ng/mL) Final   POC Buprenorphine (BUP) 11/28/2023 None Detected  NONE DETECTED (Cut Off Level 10 ng/mL) Final   POC Oxazepam (BZO) 11/28/2023 Positive (A)  NONE DETECTED (Cut Off Level 300 ng/mL) Final   POC Cocaine UR 11/28/2023 None Detected  NONE DETECTED (Cut Off Level 300 ng/mL) Final   POC Methamphetamine UR 11/28/2023 None Detected  NONE DETECTED (Cut Off Level 1000 ng/mL) Final   POC Morphine  11/28/2023 None Detected  NONE DETECTED (Cut Off Level 300 ng/mL) Final   POC Methadone UR 11/28/2023 None Detected  NONE DETECTED (Cut Off Level 300 ng/mL) Final   POC Oxycodone UR 11/28/2023 None Detected  NONE DETECTED (Cut Off Level 100 ng/mL) Final   POC Marijuana UR 11/28/2023 Positive (A)  NONE DETECTED (Cut Off Level 50 ng/mL) Final    Allergies: Patient has no known allergies.  Medications:  PTA Medications  Medication Sig   ondansetron  (ZOFRAN -ODT) 8 MG disintegrating tablet Take 1 tablet (8 mg total) by mouth every 8 (eight) hours as needed for nausea or vomiting.   alum & mag hydroxide-simeth (MAALOX MAX) 400-400-40 MG/5ML suspension Take 5 mLs by mouth every 6 (six) hours as needed for indigestion. (Patient not taking: Reported on 03/05/2022)   bismuth subsalicylate (PEPTO BISMOL) 262 MG chewable tablet Chew 524 mg by mouth as needed.   dicyclomine  (BENTYL ) 20 MG tablet Take 1 tablet (20 mg total) by mouth 3 (three) times daily as needed for spasms.   sucralfate  (CARAFATE )  1 g tablet Take 1 tablet (1 g total) by mouth 4  (four) times daily -  with meals and at bedtime.   omeprazole  (PRILOSEC) 40 MG capsule Take 1 capsule (40 mg total) by mouth 2 (two) times daily before a meal. Take at least 30 minutes before eating   hydrOXYzine  (VISTARIL ) 25 MG capsule Take 1 capsule (25 mg total) by mouth every 8 (eight) hours as needed.   hydrOXYzine  (ATARAX ) 10 MG tablet Take 1 tablet (10 mg total) by mouth 3 (three) times daily as needed.   escitalopram  (LEXAPRO ) 5 MG tablet Take 1 tablet (5 mg total) by mouth daily. Patient to take 5 mg for 3 days before discontinuing Lexapro       Medical Decision Making  Observation unit    Recommendations  Based on my evaluation the patient does not appear to have an emergency medical condition.  [name], NP 11/29/23  5:01 AM

## 2023-11-28 NOTE — BH Assessment (Signed)
 Comprehensive Clinical Assessment (CCA) Note  11/28/2023 [name] [id number]  Disposition: Terrence Gauze, NP, recommends continuous observation for safety and stabilization with psych reassessment in the AM.   The patient demonstrates the following risk factors for suicide: Chronic risk factors for suicide include: psychiatric disorder of depression and anxiety . Acute risk factors for suicide include: unemployment, social withdrawal/isolation, and loss (financial, interpersonal, professional). Protective factors for this patient include: responsibility to others (children, family) and hope for the future. Considering these factors, the overall suicide risk at this point appears to be high. Patient is not appropriate for outpatient follow up.  Terrence Warner is a 38 year old male presenting as a voluntary walk-in to [facility] due depression and anxiety. Patient denied SI, HI, psychosis and alcohol/drug usage. Patient is here for anxiety and depression.   Patient reports always feeling like he is in a state of panic and feeling like he cannot breathe at times. Patient is observed shaking. Patient unable to identify triggers or stressors. Patient is adamant about not using drugs. Patient reports worsening depressive symptoms. Patient reports "interrupted sleep" and "nonexistent appetite".   Patient does not have a therapist or psychiatrist. Patient reports he is not taking any psychiatric medications. Patient reports detox treatment approx 1 year ago. Patient denied prior psych hospitalizations, suicide attempts and self-harming behaviors.   Patient resides with girlfriend and father-n-law and 3 kids (13, 10, and 13). Patient reports having a good relationship with family. Patient is unemployed. Patient denied access to guns. Patient was anxious, tense, shaking and cooperative during assessment.   Chief Complaint:  Chief Complaint  Patient presents with   Anxiety   Visit Diagnosis:  Anxiety  Disorder    CCA Screening, Triage and Referral (STR)  Patient Reported Information How did you hear about us ? Family/Friend  What Is the Reason for Your Visit/Call Today? Pt arrived to [facility] accompanied by his family with concerns of his mental state. Pt states that he was diagnosed with depression and anxiety and he started taking medication and he stated that the medication turned him into a "zombie" deabilitating him. Pt also reports that he went through a detox treatment abouit a year and a half ago and has been unmedicated for about a year and a half ago. Pt was observed as sporadic and repeating the statement "theres jsut something truly wrong upstairs" referring to his brain. Pt reported not being able to sleep well, feeling like he cannot breathe at moments, having panic attacks, and always in a state of panic. Pt denies HI and has passive thoughts of SI that are influenced by his thoughts and panicking sttaing that "one has to go and when I start to think about it I'll choose to end it". Pt. states that he has auditory hallucinations but cannot pinpoint or describe what he is hearing at the moment. Pt. admits to smoking marijuana within the last 24 hours. Pt is urgent.  How Long Has This Been Causing You Problems? > than 6 months  What Do You Feel Would Help You the Most Today? Treatment for Depression or other mood problem; Social Support; Stress Management; Alcohol or Drug Use Treatment   Have You Recently Had Any Thoughts About Hurting Yourself? Yes  Are You Planning to Commit Suicide/Harm Yourself At This time? No   Flowsheet Row ED from 08/05/2022 in [facility] ED from 01/19/2022 in [facility] Emergency Department at [facility] ED from 01/12/2022 in [facility] Urgent Care  at [facility] RISK CATEGORY No Risk No Risk No Risk       Have you Recently Had Thoughts About Hurting Someone [name]? No  Are You Planning to Harm Someone at This  Time? No  Explanation: n/a   Have You Used Any Alcohol or Drugs in the Past 24 Hours? Yes  How Long Ago Did You Use Drugs or Alcohol? N/a What Did You Use and How Much? Marijuana, undisclosed amount.   Do You Currently Have a Therapist/Psychiatrist? No  Name of Therapist/Psychiatrist: n/a   Have You Been Recently Discharged From Any Office Practice or Programs? No  Explanation of Discharge From Practice/Program: n/a    CCA Screening Triage Referral Assessment Type of Contact: Face-to-Face  Telemedicine Service Delivery:  n/a Is this Initial or Reassessment?  N/a Date Telepsych consult ordered in [facility]:   N/a Time Telepsych consult ordered in [facility]:   N/a Location of Assessment: [facility] Assessment Services  Provider Location: [facility] Assessment Services   Collateral Involvement: n/a   Does Patient Have a Automotive engineer Guardian? No  Legal Guardian Contact Information: n/a  Copy of Legal Guardianship Form: -- (n/a)  Legal Guardian Notified of Arrival: -- (n/a)  Legal Guardian Notified of Pending Discharge: -- (n/a)  If Minor and Not Living with Parent(s), Who has Custody? n/a  Is CPS involved or ever been involved? Never  Is APS involved or ever been involved? Never   Patient Determined To Be At Risk for Harm To Self or Others Based on Review of Patient Reported Information or Presenting Complaint? No  Method: No Plan  Availability of Means: No access or NA  Intent: Vague intent or NA  Notification Required: No need or identified person  Additional Information for Danger to Others Potential: -- (n/a)  Additional Comments for Danger to Others Potential: n/a  Are There Guns or Other Weapons in Your Home? No  Types of Guns/Weapons: n/a  Are These Weapons Safely Secured?                            -- (n/a)  Who Could Verify You Are Able To Have These Secured: n/a  Do You Have any Outstanding Charges, Pending Court Dates, Parole/Probation? none  reported  Contacted To Inform of Risk of Harm To Self or Others: -- (n/a)    Does Patient Present under Involuntary Commitment? No    [place] of Residence: [place]   Patient Currently Receiving the Following Services: Not Receiving Services   Determination of Need: Urgent (48 hours)   Options For Referral: [facility]; Facility-Based Crisis; Outpatient Therapy; Chemical Dependency Intensive Outpatient Therapy (CDIOP); Other: Comment   CCA Biopsychosocial Patient Reported Schizophrenia/Schizoaffective Diagnosis in Past: No   Strengths: self-awareness   Mental Health Symptoms Depression:  Hopelessness; Fatigue; Increase/decrease in appetite; Sleep (too much or little)   Duration of Depressive symptoms: Duration of Depressive Symptoms: Greater than two weeks   Mania:  None   Anxiety:   Worrying; Tension; Sleep; Restlessness; Irritability; Fatigue; Difficulty concentrating   Psychosis:  None   Duration of Psychotic symptoms:    Trauma:  None   Obsessions:  None   Compulsions:  None   Inattention:  N/A   Hyperactivity/Impulsivity:  N/A   Oppositional/Defiant Behaviors:  None   Emotional Irregularity:  None   Other Mood/Personality Symptoms:  n/a    Mental Status Exam Appearance and self-care  Stature:  Average  Weight:  Average weight   Clothing:  Age-appropriate   Grooming:  Normal   Cosmetic use:  None   Posture/gait:  Slumped; Tense   Motor activity:  Restless   Sensorium  Attention:  Normal   Concentration:  Normal   Orientation:  X5   Recall/memory:  Normal   Affect and Mood  Affect:  Appropriate; Anxious   Mood:  Anxious; Depressed   Relating  Eye contact:  Normal   Facial expression:  Anxious; Depressed   Attitude toward examiner:  Cooperative   Thought and Language  Speech flow: Normal   Thought content:  Appropriate to Mood and Circumstances   Preoccupation:  None   Hallucinations:  None   Organization:   Coherent   [employer] of Knowledge:  Average   Intelligence:  Average   Abstraction:  Normal   Judgement:  Normal   Reality Testing:  Adequate   Insight:  Lacking   Decision Making:  Impulsive   Social Functioning  Social Maturity:  Impulsive   Social Judgement:  Naive   Stress  Stressors:  Surveyor, quantity; Transitions   Coping Ability:  Human resources officer Deficits:  Scientist, physiological; Self-control; Communication   Supports:  Family     Religion: Religion/Spirituality Are You A Religious Person?: Yes How Might This Affect Treatment?: none  Leisure/Recreation: Leisure / Recreation Do You Have Hobbies?: Yes Leisure and Hobbies: basketball, sports and family time  Exercise/Diet: Exercise/Diet Do You Exercise?: No Have You Gained or Lost A Significant Amount of Weight in the Past Six Months?: No Do You Follow a Special Diet?: No Do You Have Any Trouble Sleeping?: Yes Explanation of Sleeping Difficulties: interrupted sleep   CCA Employment/Education Employment/Work Situation: Employment / Work Situation Employment Situation: Unemployed Patient's Job has Been Impacted by Current Illness: No Has Patient ever Been in Equities trader?: No  Education: Education Is Patient Currently Attending School?: No Last Grade Completed: 12 Did You Product manager?: No Did You Have An Individualized Education Program (IIEP): No Did You Have Any Difficulty At [employer]?: No Patient's Education Has Been Impacted by Current Illness: No   CCA Family/Childhood History Family and Relationship History: Family history Marital status: Long term relationship Long term relationship, how long?: 18 years What types of issues is patient dealing with in the relationship?: uta Additional relationship information: uta Does patient have children?: Yes How many children?: 3 How is patient's relationship with their children?: good  Childhood History:  Childhood History By whom  was/is the patient raised?: Mother Did patient suffer any verbal/emotional/physical/sexual abuse as a child?: No Did patient suffer from severe childhood neglect?: No Has patient ever been sexually abused/assaulted/raped as an adolescent or adult?: No Was the patient ever a victim of a crime or a disaster?: No Witnessed domestic violence?: No Has patient been affected by domestic violence as an adult?: No  CCA Substance Use Alcohol/Drug Use: Alcohol / Drug Use Pain Medications: see MAR Prescriptions: see MAR Over the Counter: see MAR History of alcohol / drug use?: No history of alcohol / drug abuse Longest period of sobriety (when/how long): n/a Negative Consequences of Use:  (n/a) Withdrawal Symptoms:  (n/a)    ASAM's:  Six Dimensions of Multidimensional Assessment  Dimension 1:  Acute Intoxication and/or Withdrawal Potential:   Dimension 1:  Description of individual's past and current experiences of substance use and withdrawal: n/a  Dimension 2:  Biomedical Conditions and Complications:   Dimension 2:  Description of patient's biomedical conditions  and  complications: n/a  Dimension 3:  Emotional, Behavioral, or Cognitive Conditions and Complications:  Dimension 3:  Description of emotional, behavioral, or cognitive conditions and complications: n/a  Dimension 4:  Readiness to Change:  Dimension 4:  Description of Readiness to Change criteria: n/a  Dimension 5:  Relapse, Continued use, or Continued Problem Potential:  Dimension 5:  Relapse, continued use, or continued problem potential critiera description: n/a  Dimension 6:  Recovery/Living Environment:  Dimension 6:  Recovery/Iiving environment criteria description: n/a  ASAM Severity Score:    ASAM Recommended Level of Treatment: ASAM Recommended Level of Treatment:  (n/a)   Substance use Disorder (SUD) Substance Use Disorder (SUD)  Checklist Symptoms of Substance Use:  (n/a)  Recommendations for  Services/Supports/Treatments: Recommendations for Services/Supports/Treatments Recommendations For Services/Supports/Treatments: Individual Therapy, Other (Comment), Medication Management  Disposition Recommendation per psychiatric provider:  Recommends continuous observation.   DSM5 Diagnoses: Patient Active Problem List   Diagnosis Date Noted   PTSD (post-traumatic stress disorder) 08/05/2022   Generalized anxiety disorder 08/05/2022   Moderate episode of recurrent major depressive disorder (HCC) 08/05/2022   Traumatic synovial effusion 04/02/2020     Referrals to Alternative Service(s): Referred to Alternative Service(s):   Place:   Date:   Time:    Referred to Alternative Service(s):   Place:   Date:   Time:    Referred to Alternative Service(s):   Place:   Date:   Time:    Referred to Alternative Service(s):   Place:   Date:   Time:     Adelfa Adolph, [facility]

## 2023-11-28 NOTE — ED Notes (Signed)
 Patient reported increased anxiety of 10/10. Pt was observed shaking and tearful. Medications given will follow up later on its effectiveness.

## 2023-11-28 NOTE — ED Notes (Signed)
 Patient read and understood leaving hospital against medical advice form and signed it. Patient left with all his belonging

## 2023-11-28 NOTE — Progress Notes (Signed)
   11/28/23 1740  [facility] Triage Screening (Walk-ins at [facility] only)  How Did You Hear About Us ? Family/Friend  What Is the Reason for Your Visit/Call Today? Pt arrived to [facility] accompanied by his family with concerns of his mental state. Pt states that he was diagnosed with depression and anxiety and he started taking medication and he stated that the medication turned him into a "zombie" deabilitating him. Pt also reports that he went through a detox treatment abouit a year and a half ago and has been unmedicated for about a year and a half ago. Pt was observed as sporadic and repeating the statement "theres jsut something truly wrong upstairs" referring to his brain. Pt reported not being able to sleep well, feeling like he cannot breathe at moments, having panic attacks, and always in a state of panic. Pt denies HI and has passive thoughts of SI that are influenced by his thoughts and panicking sttaing that "one has to go and when I start to think about it I'll choose to end it". Pt. states that he has auditory hallucinations but cannot pinpoint or describe what he is hearing at the moment. Pt. admits to smoking marijuana within the last 24 hours. Pt is urgent.  How Long Has This Been Causing You Problems? > than 6 months  Have You Recently Had Any Thoughts About Hurting Yourself? Yes  How long ago did you have thoughts about hurting yourself? Passive thoughts about self harm today  Are You Planning to Commit Suicide/Harm Yourself At This time? No  Have you Recently Had Thoughts About Hurting Someone [name]? No  Are You Planning To Harm Someone At This Time? No  Physical Abuse Denies  Verbal Abuse Denies  Sexual Abuse Denies  Exploitation of patient/patient's resources Denies  Self-Neglect Denies  Are you currently experiencing any auditory, visual or other hallucinations? Yes  Please explain the hallucinations you are currently experiencing: Auditory hallucinations but no specific description of what is  being heard.  Have You Used Any Alcohol or Drugs in the Past 24 Hours? Yes  What Did You Use and How Much? Marijuana, undisclosed amount.  Do you have any current medical co-morbidities that require immediate attention? No  Clinician description of patient physical appearance/behavior: In an anxious state, nervous, and emotional. Seems to be extremely stressed out.  What Do You Feel Would Help You the Most Today? Treatment for Depression or other mood problem;Social Support;Stress Management;Alcohol or Drug Use Treatment  If access to [facility] was not available, would you have sought care in the Emergency Department? No  Determination of Need Urgent (48 hours)  Options For Referral [facility];Facility-Based Crisis;Outpatient Therapy;Chemical Dependency Intensive Outpatient Therapy (CDIOP);Other: Comment  Determination of Need filed? Yes

## 2024-03-14 ENCOUNTER — Ambulatory Visit (HOSPITAL_COMMUNITY)
Admission: EM | Admit: 2024-03-14 | Discharge: 2024-03-14 | Disposition: A | Discharge status: Home / Self Care | Payer: Self-pay | Attending: Family Medicine | Admitting: Family Medicine

## 2024-03-14 ENCOUNTER — Encounter (HOSPITAL_COMMUNITY): Payer: Self-pay

## 2024-03-14 DIAGNOSIS — M79641 Pain in right hand: Secondary | ICD-10-CM

## 2024-03-14 DIAGNOSIS — M542 Cervicalgia: Secondary | ICD-10-CM

## 2024-03-14 DIAGNOSIS — M79601 Pain in right arm: Secondary | ICD-10-CM

## 2024-03-14 MED ORDER — PREDNISONE 20 MG PO TABS: 40.0000 mg | ORAL_TABLET | Freq: Every day | ORAL | 0 refills | Status: AC

## 2024-03-14 MED ORDER — KETOROLAC TROMETHAMINE 30 MG/ML IJ SOLN
30.0000 mg | Freq: Once | INTRAMUSCULAR | Status: AC
  Administered 2024-03-14 (×2): 30 mg via INTRAMUSCULAR

## 2024-03-14 MED ORDER — KETOROLAC TROMETHAMINE 30 MG/ML IJ SOLN
INTRAMUSCULAR | Status: AC
  Filled 2024-03-14: qty 1

## 2024-03-14 MED ORDER — METHYLPREDNISOLONE SODIUM SUCC 125 MG IJ SOLR
60.0000 mg | Freq: Once | INTRAMUSCULAR | Status: AC
  Administered 2024-03-14 (×2): 60 mg via INTRAMUSCULAR

## 2024-03-14 MED ORDER — TIZANIDINE HCL 4 MG PO TABS
4.0000 mg | ORAL_TABLET | Freq: Three times a day (TID) | ORAL | 0 refills | Status: AC | PRN
Start: 2024-03-14 — End: ?

## 2024-03-14 MED ORDER — METHYLPREDNISOLONE SODIUM SUCC 125 MG IJ SOLR
INTRAMUSCULAR | Status: AC
  Filled 2024-03-14: qty 2

## 2024-03-14 NOTE — Discharge Instructions (Addendum)
 You were seen today for right arm/hand pain.  As discussed, there is likely no fracture, but a flare up of chronic pain from an old injury.  I have given you a shot of a steroid today, and pain medication.  I have sent out a script for a muscle relaxer, that you should take when home and not driving as this may make you tired/sleepy.  You can start the prednisone  TOMORROW.  You may take tylenol /motrin  for pain otherwise.  If you have worsening pain then please go to the ER for further evaluation.

## 2024-03-14 NOTE — ED Provider Notes (Signed)
 [facility]    CSN: [id number] Arrival date & time: 03/14/24  9066      History   Chief Complaint Chief Complaint  Patient presents with   Arm Pain         HPI Terrence Warner is a 38 y.o. male.    Arm Pain  Patient is for arm pain.  Several years ago he fractured his right hand.  It ended up causing damage up the arm, shoulder and C7.  At that time he had issues with pain, numbness.  He had an MRI, with cortisone injections with help.  Normally he has pain about 2-3 on a regular basis.   Last week Thursday he pinched/hit his hand inbetween two pieces of metal.  He had instant pain at the hand/knuckles, but since then pain has been creeping up the arm, into the shoulder and neck.  This morning he was twisting a jar of food, and the hand cramped up, was in severe pain.  Today he is having pain, numbness and tingling into the arm and hand.  He has taken tylenol  for pain at this time.        Past Medical History:  Diagnosis Date   Diverticulosis    Pinched nerve    C-7    Patient Active Problem List   Diagnosis Date Noted   PTSD (post-traumatic stress disorder) 08/05/2022   Generalized anxiety disorder 08/05/2022   Moderate episode of recurrent major depressive disorder (HCC) 08/05/2022   Traumatic synovial effusion 04/02/2020    History reviewed. No pertinent surgical history.     Home Medications    Prior to Admission medications   Medication Sig Start Date End Date Taking? Authorizing Provider  alum & mag hydroxide-simeth (MAALOX MAX) 400-400-40 MG/5ML suspension Take 5 mLs by mouth every 6 (six) hours as needed for indigestion. Patient not taking: Reported on 03/05/2022 01/19/22   Horton, Kristie M, DO  omeprazole  (PRILOSEC) 40 MG capsule Take 1 capsule (40 mg total) by mouth 2 (two) times daily before a meal. Take at least 30 minutes before eating 03/10/22   Federico Rosario BROCKS, MD    Family History Family History  Problem Relation Age of Onset    Mental illness Mother    Healthy Father    Stomach cancer Neg Hx    Esophageal cancer Neg Hx    Colon cancer Neg Hx     Social History Social History   Tobacco Use   Smoking status: Former    Current packs/day: 0.50    Types: Cigarettes   Smokeless tobacco: Never  Vaping Use   Vaping status: Former  Substance Use Topics   Alcohol use: Not Currently   Drug use: Not Currently    Types: Marijuana, Other-see comments, Benzodiazepines, Cocaine    Comment: as of 01/19/22 urine drug screen/ no cocaine or benzos for  week     Allergies   Patient has no known allergies.   Review of Systems Review of Systems  Constitutional: Negative.   HENT: Negative.    Respiratory: Negative.    Cardiovascular: Negative.   Gastrointestinal: Negative.   Musculoskeletal:  Positive for myalgias.  Neurological:  Positive for numbness.  Psychiatric/Behavioral: Negative.       Physical Exam Triage Vital Signs ED Triage Vitals  Encounter Vitals Group     BP 03/14/24 0944 (!) 153/114     Girls Systolic BP Percentile --      Girls Diastolic BP Percentile --  Boys Systolic BP Percentile --      Boys Diastolic BP Percentile --      Pulse Rate 03/14/24 0944 95     Resp 03/14/24 0944 16     Temp 03/14/24 0944 97.9 F (36.6 C)     Temp Source 03/14/24 0944 Oral     SpO2 03/14/24 0944 96 %     Weight --      Height --      Head Circumference --      Peak Flow --      Pain Score 03/14/24 0945 6     Pain Loc --      Pain Education --      Exclude from Growth Chart --    No data found.  Updated Vital Signs BP (!) 153/114 (BP Location: Left Arm)   Pulse 95   Temp 97.9 F (36.6 C) (Oral)   Resp 16   SpO2 96%   Visual Acuity Right Eye Distance:   Left Eye Distance:   Bilateral Distance:    Right Eye Near:   Left Eye Near:    Bilateral Near:     Physical Exam Constitutional:      General: He is not in acute distress.    Appearance: Normal appearance. He is normal weight.  He is not ill-appearing.  Cardiovascular:     Rate and Rhythm: Normal rate and regular rhythm.  Pulmonary:     Breath sounds: Normal breath sounds.  Musculoskeletal:     Comments: Slight swelling to the back of the right hand;  no other deformity noted;  He has TTP to the right base of the thumb, 2nd and 3rd fingers;  +TTP to the right hand;  he has slight TTP to the proximal forearm, upper arm, shoulder, neck and cervical spine;  Full rom of the neck without limitation;  full rom of the right shoulder and elbow;  able to flex/extend the fingers normally, but with pain.   Neurological:     General: No focal deficit present.     Mental Status: He is alert.      UC Treatments / Results  Labs (all labs ordered are listed, but only abnormal results are displayed) Labs Reviewed - No data to display  EKG   Radiology No results found.  Procedures Procedures (including critical care time)  Medications Ordered in UC Medications  methylPREDNISolone  sodium succinate (SOLU-MEDROL ) 125 mg/2 mL injection 60 mg (has no administration in time range)  ketorolac  (TORADOL ) 30 MG/ML injection 30 mg (has no administration in time range)    Initial Impression / Assessment and Plan / UC Course  I have reviewed the triage vital signs and the nursing notes.  Pertinent labs & imaging results that were available during my care of the patient were reviewed by me and considered in my medical decision making (see chart for details).   Final Clinical Impressions(s) / UC Diagnoses   Final diagnoses:  Right arm pain  Right hand pain  Neck pain     Discharge Instructions      You were seen today for right arm/hand pain.  As discussed, there is likely no fracture, but a flare up of chronic pain from an old injury.  I have given you a shot of a steroid today, and pain medication.  I have sent out a script for a muscle relaxer, that you should take when home and not driving as this may make you  tired/sleepy.  You can  start the prednisone  TOMORROW.  You may take tylenol /motrin  for pain otherwise.  If you have worsening pain then please go to the ER for further evaluation.     ED Prescriptions     Medication Sig Dispense Auth. Provider   tiZANidine  (ZANAFLEX ) 4 MG tablet Take 1 tablet (4 mg total) by mouth every 8 (eight) hours as needed. 30 tablet Carsynn Bethune, MD   predniSONE  (DELTASONE ) 20 MG tablet Take 2 tablets (40 mg total) by mouth daily for 5 days. 10 tablet Darral Longs, MD      PDMP not reviewed this encounter.   Darral Longs, MD 03/14/24 1020

## 2024-03-14 NOTE — ED Triage Notes (Signed)
 Pt states hx of rt hand fx 53yrs ago. States slammed his rt hand in between 2 sheets of metal on Thursday. States today having rt arm numbness and pain. Took tylenol  with little relief.

## 2024-05-03 ENCOUNTER — Other Ambulatory Visit: Payer: Self-pay

## 2024-05-03 ENCOUNTER — Emergency Department (HOSPITAL_BASED_OUTPATIENT_CLINIC_OR_DEPARTMENT_OTHER)
Admission: EM | Admit: 2024-05-03 | Discharge: 2024-05-03 | Disposition: A | Discharge status: Home / Self Care | Attending: Emergency Medicine | Admitting: Emergency Medicine

## 2024-05-03 ENCOUNTER — Emergency Department (HOSPITAL_BASED_OUTPATIENT_CLINIC_OR_DEPARTMENT_OTHER)

## 2024-05-03 ENCOUNTER — Encounter (HOSPITAL_BASED_OUTPATIENT_CLINIC_OR_DEPARTMENT_OTHER): Payer: Self-pay | Admitting: Emergency Medicine

## 2024-05-03 DIAGNOSIS — S6991XA Unspecified injury of right wrist, hand and finger(s), initial encounter: Secondary | ICD-10-CM | POA: Diagnosis present

## 2024-05-03 DIAGNOSIS — W228XXA Striking against or struck by other objects, initial encounter: Secondary | ICD-10-CM | POA: Diagnosis not present

## 2024-05-03 DIAGNOSIS — S60221A Contusion of right hand, initial encounter: Secondary | ICD-10-CM | POA: Diagnosis not present

## 2024-05-03 NOTE — Discharge Instructions (Signed)
 Your x-rays show no evidence for fracture or dislocation.  Wear Ace wrap for comfort and support.  Take ibuprofen  600 mg every 6 hours as needed for pain.  Follow-up with primary doctor if symptoms are not improving in the next week.

## 2024-05-03 NOTE — ED Triage Notes (Addendum)
 Patient reports hitting wooden post with right hand on 9/27 - not evaluated after injury. Reports pain to right wrist and right hand since injury. Hx of fractures to same hand in 2020.

## 2024-05-03 NOTE — ED Provider Notes (Signed)
  [facility] AT [facility] Provider Note   CSN: [id number] Arrival date & time: 05/03/24  9786     Patient presents with: Hand Injury   Terrence Warner is a 38 y.o. male.   Patient is a 38 year old male presenting with complaints of right hand pain.  He states that he took out some aggression on a wooden post by punching it.  He did this 3 days ago and has had pain and swelling to the right hand since.  Pain worse with movement and no alleviating factors.       Prior to Admission medications   Medication Sig Start Date End Date Taking? Authorizing Provider  alum & mag hydroxide-simeth (MAALOX MAX) 400-400-40 MG/5ML suspension Take 5 mLs by mouth every 6 (six) hours as needed for indigestion. Patient not taking: Reported on 03/05/2022 01/19/22   Horton, Kristie M, DO  omeprazole  (PRILOSEC) 40 MG capsule Take 1 capsule (40 mg total) by mouth 2 (two) times daily before a meal. Take at least 30 minutes before eating 03/10/22   Federico Rosario BROCKS, MD  tiZANidine  (ZANAFLEX ) 4 MG tablet Take 1 tablet (4 mg total) by mouth every 8 (eight) hours as needed. 03/14/24   Darral Longs, MD    Allergies: Patient has no known allergies.    Review of Systems  All other systems reviewed and are negative.   Updated Vital Signs BP (!) 148/91   Pulse 80   Temp 97.9 F (36.6 C) (Oral)   Resp 18   Ht 5' 9 (1.753 m)   Wt 77.1 kg   SpO2 99%   BMI 25.10 kg/m   Physical Exam Vitals and nursing note reviewed.  Constitutional:      Appearance: Normal appearance.  Pulmonary:     Effort: Pulmonary effort is normal.  Musculoskeletal:     Comments: There is some swelling and ecchymosis noted throughout the dorsum of the hand and fingers.  There are several abrasions noted to the skin overlying the PIP joints, but no deformity.  He is able to flex and extend all fingers and sensation is intact throughout all fingertips.  Skin:    General: Skin is warm and dry.  Neurological:      Mental Status: He is alert and oriented to person, place, and time.     (all labs ordered are listed, but only abnormal results are displayed) Labs Reviewed - No data to display  EKG: None  Radiology: No results found.   Procedures   Medications Ordered in the ED - No data to display                                  Medical Decision Making Amount and/or Complexity of Data Reviewed Radiology: ordered.   X-rays are negative.  This to be treated as a contusion with continued use of Ace wrap, ice, ibuprofen , and follow-up as needed.     Final diagnoses:  None    ED Discharge Orders     None          Geroldine Berg, MD 05/03/24 [id number]
